# Patient Record
Sex: Female | Born: 1935 | Race: White | Hispanic: No | Marital: Married | Smoking: Never smoker
Health system: Southern US, Community
[De-identification: ages and names within clinical notes are randomized; demographics above are authoritative.]

## PROBLEM LIST (undated history)

## (undated) DIAGNOSIS — E785 Hyperlipidemia, unspecified: Secondary | ICD-10-CM

## (undated) DIAGNOSIS — R296 Repeated falls: Secondary | ICD-10-CM

## (undated) DIAGNOSIS — M1990 Unspecified osteoarthritis, unspecified site: Secondary | ICD-10-CM

## (undated) DIAGNOSIS — K219 Gastro-esophageal reflux disease without esophagitis: Secondary | ICD-10-CM

## (undated) DIAGNOSIS — Z9889 Other specified postprocedural states: Secondary | ICD-10-CM

## (undated) DIAGNOSIS — M810 Age-related osteoporosis without current pathological fracture: Secondary | ICD-10-CM

## (undated) DIAGNOSIS — N390 Urinary tract infection, site not specified: Secondary | ICD-10-CM

## (undated) DIAGNOSIS — R112 Nausea with vomiting, unspecified: Secondary | ICD-10-CM

## (undated) DIAGNOSIS — H269 Unspecified cataract: Secondary | ICD-10-CM

## (undated) DIAGNOSIS — T7840XA Allergy, unspecified, initial encounter: Secondary | ICD-10-CM

## (undated) DIAGNOSIS — I10 Essential (primary) hypertension: Secondary | ICD-10-CM

## (undated) HISTORY — DX: Essential (primary) hypertension: I10

## (undated) HISTORY — DX: Age-related osteoporosis without current pathological fracture: M81.0

## (undated) HISTORY — DX: Hyperlipidemia, unspecified: E78.5

## (undated) HISTORY — DX: Allergy, unspecified, initial encounter: T78.40XA

## (undated) HISTORY — PX: COLONOSCOPY: SHX174

## (undated) HISTORY — DX: Nausea with vomiting, unspecified: R11.2

## (undated) HISTORY — DX: Gastro-esophageal reflux disease without esophagitis: K21.9

## (undated) HISTORY — DX: Repeated falls: R29.6

## (undated) HISTORY — DX: Other specified postprocedural states: Z98.890

## (undated) HISTORY — DX: Unspecified osteoarthritis, unspecified site: M19.90

## (undated) HISTORY — PX: CHOLECYSTECTOMY: SHX55

## (undated) HISTORY — DX: Urinary tract infection, site not specified: N39.0

## (undated) HISTORY — PX: CYST REMOVAL TRUNK: SHX6283

## (undated) HISTORY — DX: Unspecified cataract: H26.9

---

## 1997-05-23 ENCOUNTER — Ambulatory Visit (HOSPITAL_COMMUNITY): Admission: RE | Admit: 1997-05-23 | Discharge: 1997-05-23 | Payer: Self-pay | Admitting: Endocrinology

## 1998-05-30 ENCOUNTER — Encounter: Payer: Self-pay | Admitting: Endocrinology

## 1998-05-30 ENCOUNTER — Ambulatory Visit (HOSPITAL_COMMUNITY): Admission: RE | Admit: 1998-05-30 | Discharge: 1998-05-30 | Payer: Self-pay | Admitting: Endocrinology

## 1998-05-31 ENCOUNTER — Ambulatory Visit (HOSPITAL_COMMUNITY): Admission: RE | Admit: 1998-05-31 | Discharge: 1998-05-31 | Payer: Self-pay | Admitting: Endocrinology

## 1998-07-18 ENCOUNTER — Ambulatory Visit (HOSPITAL_COMMUNITY): Admission: RE | Admit: 1998-07-18 | Discharge: 1998-07-18 | Payer: Self-pay | Admitting: Urology

## 1998-08-02 ENCOUNTER — Other Ambulatory Visit: Admission: RE | Admit: 1998-08-02 | Discharge: 1998-08-02 | Payer: Self-pay | Admitting: Endocrinology

## 1998-08-28 ENCOUNTER — Encounter: Payer: Self-pay | Admitting: Cardiology

## 1998-08-28 ENCOUNTER — Ambulatory Visit (HOSPITAL_COMMUNITY): Admission: RE | Admit: 1998-08-28 | Discharge: 1998-08-28 | Payer: Self-pay | Admitting: Cardiology

## 1999-01-18 ENCOUNTER — Other Ambulatory Visit: Admission: RE | Admit: 1999-01-18 | Discharge: 1999-01-18 | Payer: Self-pay | Admitting: *Deleted

## 1999-01-18 ENCOUNTER — Encounter (INDEPENDENT_AMBULATORY_CARE_PROVIDER_SITE_OTHER): Payer: Self-pay | Admitting: Specialist

## 1999-04-23 ENCOUNTER — Other Ambulatory Visit: Admission: RE | Admit: 1999-04-23 | Discharge: 1999-04-23 | Payer: Self-pay | Admitting: Urology

## 1999-06-05 ENCOUNTER — Encounter: Payer: Self-pay | Admitting: Endocrinology

## 1999-06-05 ENCOUNTER — Ambulatory Visit (HOSPITAL_COMMUNITY): Admission: RE | Admit: 1999-06-05 | Discharge: 1999-06-05 | Payer: Self-pay | Admitting: Endocrinology

## 1999-08-12 ENCOUNTER — Other Ambulatory Visit: Admission: RE | Admit: 1999-08-12 | Discharge: 1999-08-12 | Payer: Self-pay | Admitting: Endocrinology

## 1999-08-26 ENCOUNTER — Encounter: Payer: Self-pay | Admitting: Endocrinology

## 1999-08-26 ENCOUNTER — Ambulatory Visit (HOSPITAL_COMMUNITY): Admission: RE | Admit: 1999-08-26 | Discharge: 1999-08-26 | Payer: Self-pay | Admitting: Endocrinology

## 1999-12-03 ENCOUNTER — Encounter: Payer: Self-pay | Admitting: Endocrinology

## 1999-12-03 ENCOUNTER — Ambulatory Visit (HOSPITAL_COMMUNITY): Admission: RE | Admit: 1999-12-03 | Discharge: 1999-12-03 | Payer: Self-pay | Admitting: Endocrinology

## 1999-12-19 ENCOUNTER — Ambulatory Visit (HOSPITAL_COMMUNITY): Admission: RE | Admit: 1999-12-19 | Discharge: 1999-12-19 | Payer: Self-pay | Admitting: *Deleted

## 1999-12-19 ENCOUNTER — Encounter: Payer: Self-pay | Admitting: *Deleted

## 1999-12-23 ENCOUNTER — Encounter: Payer: Self-pay | Admitting: *Deleted

## 1999-12-23 ENCOUNTER — Ambulatory Visit (HOSPITAL_COMMUNITY): Admission: RE | Admit: 1999-12-23 | Discharge: 1999-12-23 | Payer: Self-pay | Admitting: *Deleted

## 2000-01-28 ENCOUNTER — Encounter: Payer: Self-pay | Admitting: General Surgery

## 2000-01-29 ENCOUNTER — Encounter: Payer: Self-pay | Admitting: General Surgery

## 2000-01-29 ENCOUNTER — Encounter (INDEPENDENT_AMBULATORY_CARE_PROVIDER_SITE_OTHER): Payer: Self-pay | Admitting: *Deleted

## 2000-01-29 ENCOUNTER — Ambulatory Visit (HOSPITAL_COMMUNITY): Admission: RE | Admit: 2000-01-29 | Discharge: 2000-01-30 | Payer: Self-pay | Admitting: General Surgery

## 2000-04-13 ENCOUNTER — Other Ambulatory Visit: Admission: RE | Admit: 2000-04-13 | Discharge: 2000-04-13 | Payer: Self-pay | Admitting: Urology

## 2000-06-11 ENCOUNTER — Encounter: Payer: Self-pay | Admitting: Endocrinology

## 2000-06-11 ENCOUNTER — Ambulatory Visit (HOSPITAL_COMMUNITY): Admission: RE | Admit: 2000-06-11 | Discharge: 2000-06-11 | Payer: Self-pay | Admitting: Endocrinology

## 2001-06-17 ENCOUNTER — Encounter: Payer: Self-pay | Admitting: Obstetrics and Gynecology

## 2001-06-17 ENCOUNTER — Ambulatory Visit (HOSPITAL_COMMUNITY): Admission: RE | Admit: 2001-06-17 | Discharge: 2001-06-17 | Payer: Self-pay | Admitting: Endocrinology

## 2001-09-09 ENCOUNTER — Ambulatory Visit (HOSPITAL_COMMUNITY): Admission: RE | Admit: 2001-09-09 | Discharge: 2001-09-09 | Payer: Self-pay | Admitting: Gastroenterology

## 2001-09-30 ENCOUNTER — Other Ambulatory Visit: Admission: RE | Admit: 2001-09-30 | Discharge: 2001-09-30 | Payer: Self-pay | Admitting: Obstetrics and Gynecology

## 2001-12-15 ENCOUNTER — Encounter: Payer: Self-pay | Admitting: Obstetrics and Gynecology

## 2001-12-15 ENCOUNTER — Encounter: Admission: RE | Admit: 2001-12-15 | Discharge: 2001-12-15 | Payer: Self-pay | Admitting: Obstetrics and Gynecology

## 2002-06-20 ENCOUNTER — Ambulatory Visit (HOSPITAL_COMMUNITY): Admission: RE | Admit: 2002-06-20 | Discharge: 2002-06-20 | Payer: Self-pay | Admitting: Obstetrics and Gynecology

## 2002-06-20 ENCOUNTER — Encounter: Payer: Self-pay | Admitting: Obstetrics and Gynecology

## 2002-10-12 ENCOUNTER — Other Ambulatory Visit: Admission: RE | Admit: 2002-10-12 | Discharge: 2002-10-12 | Payer: Self-pay | Admitting: Obstetrics and Gynecology

## 2003-06-22 ENCOUNTER — Ambulatory Visit (HOSPITAL_COMMUNITY): Admission: RE | Admit: 2003-06-22 | Discharge: 2003-06-22 | Payer: Self-pay | Admitting: Obstetrics and Gynecology

## 2003-10-04 ENCOUNTER — Other Ambulatory Visit: Admission: RE | Admit: 2003-10-04 | Discharge: 2003-10-04 | Payer: Self-pay | Admitting: Obstetrics and Gynecology

## 2004-06-27 ENCOUNTER — Ambulatory Visit (HOSPITAL_COMMUNITY): Admission: RE | Admit: 2004-06-27 | Discharge: 2004-06-27 | Payer: Self-pay | Admitting: Obstetrics and Gynecology

## 2005-08-04 ENCOUNTER — Ambulatory Visit (HOSPITAL_COMMUNITY): Admission: RE | Admit: 2005-08-04 | Discharge: 2005-08-04 | Payer: Self-pay | Admitting: Obstetrics and Gynecology

## 2005-11-12 ENCOUNTER — Other Ambulatory Visit: Admission: RE | Admit: 2005-11-12 | Discharge: 2005-11-12 | Payer: Self-pay | Admitting: Obstetrics and Gynecology

## 2006-08-10 ENCOUNTER — Ambulatory Visit (HOSPITAL_COMMUNITY): Admission: RE | Admit: 2006-08-10 | Discharge: 2006-08-10 | Payer: Self-pay | Admitting: Obstetrics and Gynecology

## 2006-10-22 ENCOUNTER — Encounter: Admission: RE | Admit: 2006-10-22 | Discharge: 2006-10-22 | Payer: Self-pay | Admitting: Endocrinology

## 2006-12-22 ENCOUNTER — Other Ambulatory Visit: Admission: RE | Admit: 2006-12-22 | Discharge: 2006-12-22 | Payer: Self-pay | Admitting: Obstetrics and Gynecology

## 2007-02-08 ENCOUNTER — Encounter: Admission: RE | Admit: 2007-02-08 | Discharge: 2007-02-08 | Payer: Self-pay | Admitting: Obstetrics and Gynecology

## 2007-08-12 ENCOUNTER — Ambulatory Visit (HOSPITAL_COMMUNITY): Admission: RE | Admit: 2007-08-12 | Discharge: 2007-08-12 | Payer: Self-pay | Admitting: Obstetrics and Gynecology

## 2007-08-31 ENCOUNTER — Encounter: Admission: RE | Admit: 2007-08-31 | Discharge: 2007-08-31 | Payer: Self-pay | Admitting: Obstetrics and Gynecology

## 2008-08-14 ENCOUNTER — Encounter: Admission: RE | Admit: 2008-08-14 | Discharge: 2008-08-14 | Payer: Self-pay | Admitting: Obstetrics and Gynecology

## 2008-12-25 ENCOUNTER — Other Ambulatory Visit: Admission: RE | Admit: 2008-12-25 | Discharge: 2008-12-25 | Payer: Self-pay | Admitting: Obstetrics and Gynecology

## 2009-08-21 ENCOUNTER — Encounter: Admission: RE | Admit: 2009-08-21 | Discharge: 2009-08-21 | Payer: Self-pay | Admitting: Endocrinology

## 2010-03-03 HISTORY — PX: AORTA SURGERY: SHX548

## 2010-03-08 ENCOUNTER — Encounter
Admission: RE | Admit: 2010-03-08 | Discharge: 2010-03-08 | Payer: Self-pay | Source: Home / Self Care | Attending: Orthopedic Surgery | Admitting: Orthopedic Surgery

## 2010-03-24 ENCOUNTER — Encounter: Payer: Self-pay | Admitting: Obstetrics and Gynecology

## 2010-04-02 ENCOUNTER — Emergency Department (INDEPENDENT_AMBULATORY_CARE_PROVIDER_SITE_OTHER)
Admission: EM | Admit: 2010-04-02 | Discharge: 2010-04-02 | Payer: No Typology Code available for payment source | Source: Home / Self Care | Admitting: Emergency Medicine

## 2010-04-02 DIAGNOSIS — R51 Headache: Secondary | ICD-10-CM

## 2010-04-02 DIAGNOSIS — M533 Sacrococcygeal disorders, not elsewhere classified: Secondary | ICD-10-CM

## 2010-05-14 ENCOUNTER — Other Ambulatory Visit: Payer: Self-pay | Admitting: Neurosurgery

## 2010-05-14 DIAGNOSIS — I739 Peripheral vascular disease, unspecified: Secondary | ICD-10-CM

## 2010-05-16 ENCOUNTER — Other Ambulatory Visit: Payer: Self-pay | Admitting: Neurosurgery

## 2010-05-16 ENCOUNTER — Ambulatory Visit
Admission: RE | Admit: 2010-05-16 | Discharge: 2010-05-16 | Disposition: A | Discharge status: Home / Self Care | Payer: Medicare Other | Source: Ambulatory Visit | Attending: Neurosurgery | Admitting: Neurosurgery

## 2010-05-16 DIAGNOSIS — I739 Peripheral vascular disease, unspecified: Secondary | ICD-10-CM

## 2010-05-16 MED ORDER — IOHEXOL 350 MG/ML SOLN
100.0000 mL | Freq: Once | INTRAVENOUS | Status: AC | PRN
  Administered 2010-05-16: 100 mL via INTRAVENOUS

## 2010-05-28 ENCOUNTER — Encounter (INDEPENDENT_AMBULATORY_CARE_PROVIDER_SITE_OTHER): Payer: Medicare Other | Admitting: Vascular Surgery

## 2010-05-28 DIAGNOSIS — I70219 Atherosclerosis of native arteries of extremities with intermittent claudication, unspecified extremity: Secondary | ICD-10-CM

## 2010-05-28 NOTE — Consult Note (Signed)
NEW PATIENT CONSULTATION  [name], [name] DOB:  1931-05-20                                       05/28/2010 [id number]#:[id number]  The patient is a pleasant 77 year old female referred by Dr. Wynetta Emery for bilateral lower extremity claudication symptoms and possible aortic stenosis.  Over the last 6-12 months she has been having increasing discomfort in her legs which begins in her hips, extends down into the thighs and eventually the calves.  This starts after walking about 2 blocks and requires her to stop and rest and then the symptoms are relieved.  She has no history of rest pain, nonhealing ulcers, infection or ulceration.  She has had a thorough evaluation by Dr. Wynetta Emery which included a CT angiogram which I have reviewed.  She also had lower extremity arterial Dopplers at [facility] with an exercise test which I have also reviewed.  Her ABIs at rest were 0.79 on the right and 0.86 on the left and these dropped significantly after 5 minutes of exercise and it required 5 minutes for return to baseline.  Her symptoms began at 2 minutes and progressed into her calves by 3-1/2 minutes.  CHRONIC MEDICAL PROBLEMS: 1. Hypertension. 2. Hyperlipidemia. 3. Negative for coronary artery disease, diabetes, COPD or stroke.  SOCIAL HISTORY:  She is married, has 2 children, is retired.  Does not use tobacco or alcohol.  FAMILY HISTORY:  Positive for coronary artery disease in her mother and father.  Negative for diabetes and stroke.  REVIEW OF SYSTEMS:  Positive for leg discomfort but all other systems are negative.  She denies any chest pain, dyspnea on exertion, PND, orthopnea.  She does have occasional blurred vision in both eyes but no specific unilateral visual problems.  All other systems are negative in complete review of systems.  PHYSICAL EXAM:  Vital signs:  Blood pressure 142/68, heart rate is 84, respirations 14.  General:  She is a well-developed,  well-nourished female in no apparent distress, alert and oriented x3.  HEENT:  Exam is normal for age.  EOMs intact.  Lungs:  Clear to auscultation.  No rhonchi or wheezing.  Cardiovascular:  Regular rhythm.  No murmurs. Carotid pulses 3+.  No audible bruits.  Abdomen:  Soft, nontender with no masses.  Musculoskeletal:  Exam reveals no major deformities. Neurological:  Normal.  Skin:  Free of rashes.  Lower extremity:  Exam reveals 2 to 3+ femoral, popliteal and 2+ dorsalis pedis and posterior tibial pulses bilaterally.  I think this patient does have some aortic disease which also may involve the right common iliac artery.  This is probably responsible for her claudication symptoms which are quite classic.  She and her husband feel that this is definitely effecting her lifestyle and she would like this evaluated and treated if possible.  I have scheduled her for an angiogram to be performed by Dr. Myra Gianotti on Tuesday, April 3 with possible PTA and stenting of her aorta and/or iliac arteries if indicated.    Quita Skye Hart [place], M.D. Electronically Signed  JDL/MEDQ  D:  05/28/2010  T:  05/28/2010  Job:  [id number]  cc:   Donalee Citrin, M.D. Brooke Bonito, M.D.

## 2010-06-04 ENCOUNTER — Ambulatory Visit (HOSPITAL_COMMUNITY)
Admission: RE | Admit: 2010-06-04 | Discharge: 2010-06-04 | Disposition: A | Discharge status: Home / Self Care | Payer: Medicare Other | Source: Ambulatory Visit | Attending: Surgery | Admitting: Surgery

## 2010-06-04 DIAGNOSIS — I70219 Atherosclerosis of native arteries of extremities with intermittent claudication, unspecified extremity: Secondary | ICD-10-CM | POA: Insufficient documentation

## 2010-06-04 DIAGNOSIS — I7409 Other arterial embolism and thrombosis of abdominal aorta: Secondary | ICD-10-CM | POA: Insufficient documentation

## 2010-06-04 DIAGNOSIS — Z01810 Encounter for preprocedural cardiovascular examination: Secondary | ICD-10-CM | POA: Insufficient documentation

## 2010-06-04 DIAGNOSIS — I700 Atherosclerosis of aorta: Secondary | ICD-10-CM

## 2010-06-04 LAB — POCT I-STAT, CHEM 8
Calcium, Ion: 1.17 mmol/L (ref 1.12–1.32)
HCT: 43 % (ref 36.0–46.0)
Potassium: 4.2 mEq/L (ref 3.5–5.1)
Sodium: 140 mEq/L (ref 135–145)

## 2010-07-15 NOTE — Op Note (Signed)
[name], [name]              ACCOUNT NO.:  [id number]  MEDICAL RECORD NO.:  [id number]           PATIENT TYPE:  O  LOCATION:  [facility]                         FACILITY:  [facility]  PHYSICIAN:  Juleen China IV, MDDATE OF BIRTH:  Jan 29, 1932  DATE OF PROCEDURE:  06/04/2010 DATE OF DISCHARGE:  06/04/2010                              OPERATIVE REPORT   PREOPERATIVE DIAGNOSES:  Bilateral claudication.  POSTOPERATIVE DIAGNOSIS:  Bilateral claudication.  PROCEDURE PERFORMED: 1. Ultrasound access, right femoral artery. 2. Abdominal aortogram. 3. Stent, abdominal aorta. 4. Stent, right common iliac artery. 5. Closure device.  INDICATIONS:  [name] is a 77 year old female scheduled to undergo back surgery, but has had lifestyle-limiting claudication.  She had a positive exercise test indicating aortoiliac occlusive disease.  She comes in today for arteriogram, possible intervention.  PROCEDURE:  The patient was identified in the holding and taken to room A, placed supine on the table.  The right groin was prepped and draped in usual fashion.  Time-out was called.  Ultrasound was used to evaluate the artery.  It was found to be patent with mild calcification.  It was accessed under ultrasound guidance with an 18-gauge needle.  An 0.035 wire was advanced in the aorta under fluoroscopic visualization.  A 5- French sheath was placed.  An Omni flush catheter was advanced to level of L1.  Abdominal aortogram was obtained in AP and lateral projections.  FINDINGS:  The visualized portions of the suprarenal abdominal aorta showed no significant disease.  There are single renal arteries bilaterally, which are widely patent.  There is a high-grade approximately 85% stenosis within the mid abdominal aorta with a surrounding calcified aorta.  The left common iliac artery is patent. There is mild luminal irregularity at its midportion.  This did not appear to be hemodynamically significant.   The left external iliac artery is widely patent.  The left hypogastric artery is patent.  The right common iliac artery is patent.  There was a high-grade stenosis approximately 70% in its midportion.  The external and internal right iliac arteries are patent.  At this point in time, the decision was made to intervene.  Over a Rosen wire, a 7-French sheath was advanced into the abdominal aorta.  The patient was given systemic heparinization.  I elected to place a 12 x 4 EV3 self-expanding stent across the midaortic lesion.  This was molded to confirmation with a 10-mm balloon taking it to 4 atmospheres.  The waist did resolve.  However, on a followup study, there still appeared to be residual stenosis due to the calcified plaque in this area.  I went back and re-ballooned this area.  This time taking the balloon to 6 atmospheres, which was a nominal pressure on the 10-mm balloon.  I checked the pressure at this point above and below the lesion and despite there being narrowing within the stent, there was no pressure gradient across this area.  At this point, I elected to intervene on the right common iliac lesion.  I placed an Abbott Omni length 9 x 28 balloon expandable stent taking the balloon to nominal pressure.  Completion arteriogram was performed, which showed resolution of stenosis within the right common iliac artery.  At this point, a retrograde shot was performed for closure and found to be adequate.  A ProGlide was successfully deployed.  IMPRESSION: 1. Midaortic stenosis treated with a 12 x 4 EV3 self-expanding stent.     There was residual narrowing.  However, there was no pressure     gradient across the aorta. 2. A high-grade right common iliac stenosis successfully treated using     9 x 28 Omnilink self-expanding stent. 3. Closure device.     Jorge Ny, MD     VWB/MEDQ  D:  06/04/2010  T:  06/05/2010  Job:  [id number]  Electronically Signed by Arelia Longest IV MD on 07/15/2010 10:53:00 PM

## 2010-07-16 ENCOUNTER — Encounter (INDEPENDENT_AMBULATORY_CARE_PROVIDER_SITE_OTHER): Payer: Medicare Other

## 2010-07-16 ENCOUNTER — Ambulatory Visit (INDEPENDENT_AMBULATORY_CARE_PROVIDER_SITE_OTHER): Payer: Medicare Other | Admitting: Vascular Surgery

## 2010-07-16 DIAGNOSIS — Z48812 Encounter for surgical aftercare following surgery on the circulatory system: Secondary | ICD-10-CM

## 2010-07-16 DIAGNOSIS — I70219 Atherosclerosis of native arteries of extremities with intermittent claudication, unspecified extremity: Secondary | ICD-10-CM

## 2010-07-17 NOTE — Assessment & Plan Note (Signed)
OFFICE VISIT  [name], [name] DOB:  April 18, 1931                                       07/16/2010 [id number]#:[id number]  Patient is a 77 year old female patient evaluated by me in March 27 of this year for severe claudication, both lower extremities, and suspicion of aortoiliac occlusive disease.  She underwent aortic stenting and right common iliac stenting by Dr. Durene Cal on April 3.  She had a concentric calcification in her infrarenal aorta which did respond fairly well to angioplasty and stenting.  She has had an excellent hemodynamic result.  Today I ordered lower extremity arterial Dopplers, which I have reviewed and interpreted.  ABIs are now greater than 1 bilaterally, having been in the 0.75 to 0.8 range prior to the procedure.  She has total relief of her symptoms, stating she can ambulate as long as she would like with no pain.  She is being followed by Dr. Wynetta Emery for cervical disk disease.  STABLE CHRONIC MEDICAL PROBLEMS: 1. Hypertension. 2. Hyperlipidemia. 3. Cervical disk disease. 4. Negative for coronary artery disease, diabetes, COPD or stroke.  SOCIAL HISTORY:  She is married and has 2 children.  Is retired.  Does not use tobacco or alcohol.  FAMILY HISTORY:  Positive for coronary artery disease in her mother and father.  Negative for diabetes and stroke.  REVIEW OF SYSTEMS:  Negative for chest pain, dyspnea on exertion, PND, orthopnea.  Has occasional blurred vision.  All other systems are completely negative in review of systems.  PHYSICAL EXAMINATION:  Blood pressure 125/32, heart rate 54, respirations 16.  General:  She is a well-developed and well-nourished female in no apparent distress, alert and oriented x3.  HEENT:  Normal for age.  EOMs intact.  Lungs:  Clear to auscultation.  No rhonchi or wheezing.  Cardiovascular:  Regular rhythm.  No murmurs.  Carotid pulses are 3+.  No audible bruits.  Abdomen:  Soft, nontender  with no masses. She has 3+ femoral, popliteal, dorsalis pedis, and posterior tibial pulses palpable bilaterally.  Musculoskeletal exam is free of major deformities.  Neurologic:  Normal.  Skin is free of rashes.  I think she has had an excellent result.  Will follow her on a regular basis in the vascular lab for patency of her stented vessels.    Quita Skye Hart [place], M.D. Electronically Signed  JDL/MEDQ  D:  07/16/2010  T:  07/17/2010  Job:  [id number]  cc:   Brooke Bonito, M.D. Donalee Citrin, M.D.

## 2010-07-19 NOTE — Op Note (Signed)
[place]. [facility]  Patient:    [name], HELLBERG Visit Number: [id number] MRN: [id number]          Service Type: END Location: ENDO Attending Physician:  Orland Mustard Dictated by:   Llana Aliment. Randa Evens, M.D. Proc. Date: 09/09/01 Admit Date:  09/09/2001 Discharge Date: 09/09/2001   CC:         Lafonda Mosses B. Thomasena Edis, M.D.  Bernadene Person, M.D.   Operative Report  DATE OF BIRTH:  [birth year]  PROCEDURE:  Colonoscopy.  MEDICATIONS:  Fentanyl 100 mcg, Versed 10 mg IV.  INSTRUMENT:  Olympus pediatric colonoscope.  INDICATIONS:  Colon cancer screening.  DESCRIPTION OF PROCEDURE:  The procedure has been explained to the patient and consent obtained. The patient placed in the left lateral decubitus position. The Olympus pediatric scope was inserted and advanced under direct visualization. The prep was excellent. The patient had a very long tortuous colon. Multiple position changes were required and eventually we were able to advance to the cecum with the patient on the right side. The ileocecal valve and appendiceal orifice were seen. The scope was withdrawn. The patient tolerated the procedure well.  ASSESSMENT:  Essentially normal screening colonoscopy.  PLAN:  Will recommend routine follow up with yearly Hemoccults to check stool for blood yearly. Consider another colon screening in 5-10 years. Dictated by:   Llana Aliment. Randa Evens, M.D. Attending Physician:  Orland Mustard DD:  09/09/01 TD:  09/12/01 Job: [id number]

## 2010-07-19 NOTE — Op Note (Signed)
[place]. [facility]  Patient:    [name], SAHM                     MRN: [id number] Proc. Date: 01/29/00 Adm. Date:  [id number] Attending:  Delsa Bern CC:         Sabino Gasser, M.D.  Bernadene Person, M.D.   Operative Report  PREOPERATIVE DIAGNOSIS:  Cholelithiasis.  POSTOPERATIVE DIAGNOSIS:  Cholelithiasis.  SURGICAL PROCEDURE:  Laparoscopic cholecystectomy with intraoperative cholangiogram.  SURGEON:  Sharlet Salina T. Hoxworth, M.D.  ASSISTANTAngelia Mould. Derrell Lolling, M.D.  ANESTHESIA:  General.  BRIEF HISTORY:  This patient is a 77 year old white female who on a recent evaluation for weight loss had a CT scan of the abdomen performed, which revealed not only cholelithiasis, but a questionable lesion in the caudate lobe of the liver adjacent to the gallbladder and slight dilatation of the common bile duct.  Concern was raised about cholangiocarcinoma or a liver lesion.  She subsequently had an MR cholangiogram which did not confirm a liver lesion.  ERCP was attempted, which was unsuccessful.  She has some vague lower abdominal pain.  With this constellation of findings, we elected to proceed with laparoscopic cholecystectomy and intraoperative cholangiogram and evaluation of the liver laparoscopically.  The procedure, its indications, risks of bleeding, infection, bile leak, pelvic injury, and possible need for open procedure were discussed and understood preoperatively.  She is now brought to the operating room for this procedure.  DESCRIPTION OF PROCEDURE:  The patient was brought to the operating room and placed in the supine position on the operating table.  General anesthesia was induced.  The abdomen was sterilely prepped and draped.  She received preoperative antibiotics.  Local anesthesia was used to infiltrate the trocar sites.  A 1 cm incision was made at the umbilicus and dissection was carried down to the midline fascia.   This was sharply incised for 1 cm.  The peritoneum was entered under direct vision.  A Hasson trocar was placed through  mattress sutures of 0 Vicryl and pneumoperitoneum established.  Under direct vision, a 10 mm trocar was placed in the subxiphoid area and two 5 mm trocars along the right subcostal margin.  The gallbladder was visualized and appeared grossly normal, slightly distended.  There were a few adhesions of omentum up to the gallbladder that were taken down with scissors and cautery. The gallbladder was retracted back up over the liver and the infundibulum retracted inferolaterally.  This exposed nicely the caudate lobe and the infundibulum of the gallbladder.  The liver appeared entirely normal with particular attention to the caudate lobe and photographs were taken. Following this, fibrofatty tissue was stripped off the neck of the gallbladder toward the porta hepatis.  The cystic artery was identified and dissected coarsely from the gallbladder wall and was divided between two proximal and one distal clip.  Following this, the cystic duct was dissected free and the cystic duct and gallbladder junction dissected 350 degrees.  When the anatomy was clear, the cystic duct was clipped at the junction of the gallbladder and an operative cholangiogram was obtained through the cystic duct.  There were normal with possibly just some slight dilatation of the common hepatic duct, but there was free flow into the duodenum.  No strictures.  No filling defects.  Following this, the cholangiocath was removed and the cystic duct doubly clipped proximally and divided.  The gallbladder was then dissected from its bed using  hook and spatula cautery and removed intact through the umbilicus.  The operative site was inspected for hemostasis, which was complete.  The trocars were removed under direct vision and all CO2 evacuated from the peritoneal cavity.  The pursestring suture was secured to  the umbilicus.  The skin incisions were closed with interrupted subcuticular 4-0 Monocryl and Dermabond.  The sponge, needle, and instrument counts were correct.  The patient was taken to recovery in good condition, having tolerated the procedure well. DD:  01/29/00 TD:  01/29/00 Job: [id number]

## 2010-07-25 ENCOUNTER — Other Ambulatory Visit (HOSPITAL_COMMUNITY): Payer: Self-pay | Admitting: Endocrinology

## 2010-07-25 ENCOUNTER — Other Ambulatory Visit: Payer: Self-pay | Admitting: Endocrinology

## 2010-07-25 DIAGNOSIS — Z1231 Encounter for screening mammogram for malignant neoplasm of breast: Secondary | ICD-10-CM

## 2010-08-27 ENCOUNTER — Ambulatory Visit
Admission: RE | Admit: 2010-08-27 | Discharge: 2010-08-27 | Disposition: A | Discharge status: Home / Self Care | Payer: Medicare Other | Source: Ambulatory Visit | Attending: Endocrinology | Admitting: Endocrinology

## 2010-08-27 DIAGNOSIS — Z1231 Encounter for screening mammogram for malignant neoplasm of breast: Secondary | ICD-10-CM

## 2010-09-18 ENCOUNTER — Other Ambulatory Visit: Payer: Self-pay | Admitting: Internal Medicine

## 2011-01-22 ENCOUNTER — Other Ambulatory Visit (INDEPENDENT_AMBULATORY_CARE_PROVIDER_SITE_OTHER): Payer: Medicare Other | Admitting: *Deleted

## 2011-01-22 ENCOUNTER — Ambulatory Visit (INDEPENDENT_AMBULATORY_CARE_PROVIDER_SITE_OTHER): Payer: Medicare Other | Admitting: *Deleted

## 2011-01-22 DIAGNOSIS — Z48812 Encounter for surgical aftercare following surgery on the circulatory system: Secondary | ICD-10-CM

## 2011-01-22 DIAGNOSIS — I700 Atherosclerosis of aorta: Secondary | ICD-10-CM

## 2011-01-30 ENCOUNTER — Encounter: Payer: Self-pay | Admitting: Vascular Surgery

## 2011-01-30 NOTE — Procedures (Unsigned)
AORTA-ILIAC DUPLEX EVALUATION  INDICATION:  Aorto-iliac stents.  HISTORY: Diabetes:  No. Cardiac:  No. Hypertension:  Yes. Smoking:  No. Previous Surgery:  Abdominal aortic and left common iliac artery stents on 06/04/2010.              SINGLE LEVEL ARTERIAL EXAM                             RIGHT                  LEFT Brachial: Anterior tibial: Posterior tibial: Peroneal: Ankle/brachial index: Previous ABI/date:  AORTA-ILIAC DUPLEX EXAM Aorta - Proximal     47 cm/s Aorta - Mid          86 cm/s Aorta - Distal       160 cm/s  RIGHT                                   LEFT 85 cm/s           CIA-PROXIMAL          135 cm/s 112 cm/s          CIA-DISTAL            113 cm/s                   HYPOGASTRIC                   EIA-PROXIMAL                   EIA-MID                   EIA-DISTAL  IMPRESSION:  Patent abdominal aortic and left common iliac artery stents with calcific shadowing of the distal abdominal aorta noted.  Velocity of 160 cm/s noted in the distal abdominal aorta, baed on limited visualization.  ___________________________________________ Quita Skye Hart [place], M.D.  CH/MEDQ  D:  01/22/2011  T:  01/22/2011  Job:  [id number]

## 2011-02-03 ENCOUNTER — Other Ambulatory Visit: Payer: Self-pay

## 2011-02-03 DIAGNOSIS — Z48812 Encounter for surgical aftercare following surgery on the circulatory system: Secondary | ICD-10-CM

## 2011-02-03 DIAGNOSIS — I700 Atherosclerosis of aorta: Secondary | ICD-10-CM

## 2011-07-09 ENCOUNTER — Encounter (INDEPENDENT_AMBULATORY_CARE_PROVIDER_SITE_OTHER): Payer: Medicare Other | Admitting: *Deleted

## 2011-07-09 ENCOUNTER — Other Ambulatory Visit (INDEPENDENT_AMBULATORY_CARE_PROVIDER_SITE_OTHER): Payer: Medicare Other | Admitting: *Deleted

## 2011-07-09 DIAGNOSIS — I700 Atherosclerosis of aorta: Secondary | ICD-10-CM

## 2011-07-09 DIAGNOSIS — Z48812 Encounter for surgical aftercare following surgery on the circulatory system: Secondary | ICD-10-CM

## 2011-07-14 NOTE — Procedures (Unsigned)
AORTA-ILIAC DUPLEX EVALUATION  INDICATION:  Follow up aorto-iliac stents.  HISTORY: Diabetes:  No. Cardiac:  No. Hypertension:  Yes. Smoking:  No. Previous Surgery:  Abdominal aortic and left common iliac artery stents on 06/04/2010.              SINGLE LEVEL ARTERIAL EXAM                             RIGHT                  LEFT Brachial:                  170                    169 Anterior tibial:           172                    180 Posterior tibial:          197                    193 Peroneal: Ankle/brachial index:      1.16                   1.14 Previous ABI/date:         01/22/2011, 1.12       01/22/2011, 1.15  AORTA-ILIAC DUPLEX EXAM Aorta - Proximal     114 cm/s Aorta - Mid          104 cm/s Aorta - Distal       139 cm/s  RIGHT                                   LEFT 165 cm/s          CIA-PROXIMAL          156 cm/s 193 cm/s          CIA-DISTAL            169 cm/s                   HYPOGASTRIC                   EIA-PROXIMAL                   EIA-MID                   EIA-DISTAL  IMPRESSION: 1. Patent abdominal aortic and left common iliac artery stents with     calcific shadowing of the distal abdominal aorta noted.  There is a     velocity of 139 cm/s noted in the distal abdominal aorta. 2. Bilateral ankle brachial indices are within normal limits.  ___________________________________________ [name], M.D.  EM/MEDQ  D:  07/09/2011  T:  07/09/2011  Job:  [id number]

## 2011-07-28 ENCOUNTER — Other Ambulatory Visit (HOSPITAL_BASED_OUTPATIENT_CLINIC_OR_DEPARTMENT_OTHER): Payer: Self-pay | Admitting: Endocrinology

## 2011-07-30 ENCOUNTER — Other Ambulatory Visit: Payer: Self-pay | Admitting: Endocrinology

## 2011-07-30 DIAGNOSIS — Z1231 Encounter for screening mammogram for malignant neoplasm of breast: Secondary | ICD-10-CM

## 2011-09-02 ENCOUNTER — Ambulatory Visit: Payer: Medicare Other

## 2011-09-02 ENCOUNTER — Ambulatory Visit (INDEPENDENT_AMBULATORY_CARE_PROVIDER_SITE_OTHER): Payer: Medicare Other

## 2011-09-02 DIAGNOSIS — Z1231 Encounter for screening mammogram for malignant neoplasm of breast: Secondary | ICD-10-CM

## 2012-05-04 ENCOUNTER — Other Ambulatory Visit: Payer: Self-pay | Admitting: Endocrinology

## 2012-07-20 ENCOUNTER — Encounter: Payer: Self-pay | Admitting: Obstetrics & Gynecology

## 2012-07-20 ENCOUNTER — Ambulatory Visit (INDEPENDENT_AMBULATORY_CARE_PROVIDER_SITE_OTHER): Payer: Medicare Other | Admitting: Obstetrics & Gynecology

## 2012-07-20 VITALS — BP 132/66 | HR 80 | Resp 14 | Ht 64.0 in | Wt 118.0 lb

## 2012-07-20 DIAGNOSIS — Z01419 Encounter for gynecological examination (general) (routine) without abnormal findings: Secondary | ICD-10-CM

## 2012-07-20 DIAGNOSIS — Z0000 Encounter for general adult medical examination without abnormal findings: Secondary | ICD-10-CM

## 2012-07-20 NOTE — Progress Notes (Signed)
Subjective:    [name] is a 79 y.o. female who presents for an annual exam. The patient has no complaints today. The patient is not currently sexually active. GYN screening history: last pap: was normal. The patient wears seatbelts: yes. The patient participates in regular exercise: yes. Has the patient ever been transfused or tattooed?: no. The patient reports that there is not domestic violence in her life.   Menstrual History: OB History   Grav Para Term Preterm Abortions TAB SAB Ect Mult Living                  Menarche age: 51  No LMP recorded. Patient is postmenopausal.    The following portions of the patient's history were reviewed and updated as appropriate: allergies, current medications, past family history, past medical history, past social history, past surgical history and problem list.  Review of Systems A comprehensive review of systems was negative. She has been married for 57 years and lives with her husband in a retirement community. She is retired from the [employer]. Her mammogram, DEXA, and colonoscopy are utd.   Objective:    BP 132/66  Pulse 80  Resp 14  Ht 5\' 4"  (1.626 m)  Wt 118 lb (53.524 kg)  BMI 20.24 kg/m2  General Appearance:    Alert, cooperative, no distress, appears stated age  Head:    Normocephalic, without obvious abnormality, atraumatic  Eyes:    PERRL, conjunctiva/corneas clear, EOM's intact, fundi    benign, both eyes  Ears:    Normal TM's and external ear canals, both ears  Nose:   Nares normal, septum midline, mucosa normal, no drainage    or sinus tenderness  Throat:   Lips, mucosa, and tongue normal; teeth and gums normal  Neck:   Supple, symmetrical, trachea midline, no adenopathy;    thyroid:  no enlargement/tenderness/nodules; no carotid   bruit or JVD  Back:     Symmetric, no curvature, ROM normal, no CVA tenderness  Lungs:     Clear to auscultation bilaterally, respirations unlabored  Chest Wall:    No  tenderness or deformity   Heart:    Regular rate and rhythm, S1 and S2 normal, no murmur, rub   or gallop  Breast Exam:    No tenderness, masses, or nipple abnormality  Abdomen:     Soft, non-tender, bowel sounds active all four quadrants,    no masses, no organomegaly  Genitalia:    Normal female without lesion, discharge or tenderness, severe vulvovaginal atrophy, no vaginal lesions, NSSR, NT, no adnexal masses     Extremities:   Extremities normal, atraumatic, no cyanosis or edema  Pulses:   2+ and symmetric all extremities  Skin:   Skin color, texture, turgor normal, no rashes or lesions  Lymph nodes:   Cervical, supraclavicular, and axillary nodes normal  Neurologic:   CNII-XII intact, normal strength, sensation and reflexes    throughout  .    Assessment:    Healthy female exam.    Plan:     Breast self exam technique reviewed and patient encouraged to perform self-exam monthly.

## 2012-09-07 ENCOUNTER — Ambulatory Visit (INDEPENDENT_AMBULATORY_CARE_PROVIDER_SITE_OTHER): Payer: Medicare Other

## 2012-09-07 DIAGNOSIS — Z1231 Encounter for screening mammogram for malignant neoplasm of breast: Secondary | ICD-10-CM

## 2012-10-19 ENCOUNTER — Other Ambulatory Visit: Payer: Self-pay

## 2012-10-19 ENCOUNTER — Emergency Department (HOSPITAL_BASED_OUTPATIENT_CLINIC_OR_DEPARTMENT_OTHER)
Admission: EM | Admit: 2012-10-19 | Discharge: 2012-10-19 | Disposition: A | Discharge status: Home / Self Care | Payer: Medicare Other | Attending: Emergency Medicine | Admitting: Emergency Medicine

## 2012-10-19 ENCOUNTER — Encounter (HOSPITAL_BASED_OUTPATIENT_CLINIC_OR_DEPARTMENT_OTHER): Payer: Self-pay | Admitting: *Deleted

## 2012-10-19 DIAGNOSIS — M129 Arthropathy, unspecified: Secondary | ICD-10-CM | POA: Insufficient documentation

## 2012-10-19 DIAGNOSIS — R079 Chest pain, unspecified: Secondary | ICD-10-CM

## 2012-10-19 DIAGNOSIS — Z79899 Other long term (current) drug therapy: Secondary | ICD-10-CM | POA: Insufficient documentation

## 2012-10-19 DIAGNOSIS — K219 Gastro-esophageal reflux disease without esophagitis: Secondary | ICD-10-CM | POA: Insufficient documentation

## 2012-10-19 DIAGNOSIS — M542 Cervicalgia: Secondary | ICD-10-CM | POA: Insufficient documentation

## 2012-10-19 DIAGNOSIS — E785 Hyperlipidemia, unspecified: Secondary | ICD-10-CM | POA: Insufficient documentation

## 2012-10-19 DIAGNOSIS — G478 Other sleep disorders: Secondary | ICD-10-CM | POA: Insufficient documentation

## 2012-10-19 DIAGNOSIS — I10 Essential (primary) hypertension: Secondary | ICD-10-CM | POA: Insufficient documentation

## 2012-10-19 DIAGNOSIS — Z7982 Long term (current) use of aspirin: Secondary | ICD-10-CM | POA: Insufficient documentation

## 2012-10-19 DIAGNOSIS — Z9889 Other specified postprocedural states: Secondary | ICD-10-CM | POA: Insufficient documentation

## 2012-10-19 DIAGNOSIS — R12 Heartburn: Secondary | ICD-10-CM | POA: Insufficient documentation

## 2012-10-19 NOTE — ED Provider Notes (Signed)
CSN: [id number]     Arrival date & time 10/19/12  1356 History     First MD Initiated Contact with Patient 10/19/12 1406     Chief Complaint  Patient presents with  . Facial Pain    HPI   Patient presents with an episode of a strange feeling in her mouth and jaw and throat. They just returned from lunch. States she sat on the couch and the feeling began. It lasted 5 or 10 minutes. It was not tightness, not in pain, or pressure. No associated nausea. No shortness of breath. No diaphoresis. She states she has occasional heartburn she states she thought this was what it was. The feeling in her back of her throat was a little bit different for her. After about 10 minutes her husband got her some aspirin. She saw this was a drink of water and she states the pain went instantly away. She is very active. She exercises every day. [facility] she does water aerobics or power walking for 45 minutes to 60 minutes every day she did yesterday. She did today. He is on no recent exertional symptoms that limit her in any way. She has no history of cardiac disease. Past Medical History  Diagnosis Date  . Hyperlipidemia   . Hypertension   . Allergy   . Arthritis    Past Surgical History  Procedure Laterality Date  . Aorta surgery  03/03/2010   Family History  Problem Relation Age of Onset  . Heart disease Mother   . Heart disease Father    History  Substance Use Topics  . Smoking status: Never Smoker   . Smokeless tobacco: Never Used  . Alcohol Use: Yes     Comment: socially   OB History   Grav Para Term Preterm Abortions TAB SAB Ect Mult Living                 Review of Systems  Constitutional: Negative for fever, chills, diaphoresis, appetite change and fatigue.  HENT: Positive for neck pain. Negative for sore throat, mouth sores and trouble swallowing.   Eyes: Negative for visual disturbance.  Respiratory: Negative for cough, chest tightness, shortness of breath and wheezing.    Cardiovascular: Negative for chest pain.  Gastrointestinal: Negative for nausea, vomiting, abdominal pain, diarrhea and abdominal distention.  Endocrine: Negative for polydipsia, polyphagia and polyuria.  Genitourinary: Negative for dysuria, frequency and hematuria.  Musculoskeletal: Negative for gait problem.  Skin: Negative for color change, pallor and rash.  Neurological: Negative for dizziness, syncope, light-headedness and headaches.  Hematological: Does not bruise/bleed easily.  Psychiatric/Behavioral: Negative for behavioral problems and confusion.    Allergies  Review of patient's allergies indicates no known allergies.  Home Medications   Current Outpatient Rx  Name  Route  Sig  Dispense  Refill  . aspirin 81 MG tablet   Oral   Take 81 mg by mouth daily.         . calcium acetate (PHOSLO) 667 MG capsule   Oral   Take 667 mg by mouth 3 (three) times daily with meals.         . cetirizine (ZYRTEC) 10 MG tablet   Oral   Take 10 mg by mouth daily.         . CRESTOR 20 MG tablet   Oral   Take 20 mg by mouth daily.         . fish oil-omega-3 fatty acids 1000 MG capsule   Oral  Take 2 g by mouth daily.         Marland Kitchen gabapentin (NEURONTIN) 300 MG capsule   Oral   Take 300 mg by mouth daily.         Marland Kitchen lisinopril-hydrochlorothiazide (PRINZIDE,ZESTORETIC) 10-12.5 MG per tablet   Oral   Take 10-12.5 tablets by mouth daily.         . Multiple Vitamin (MULTIVITAMIN) capsule   Oral   Take 1 capsule by mouth daily.          BP 142/57  Pulse 93  Temp(Src) 98.1 F (36.7 C) (Oral)  Resp 18  Ht 5\' 4"  (1.626 m)  Wt 119 lb (53.978 kg)  BMI 20.42 kg/m2  SpO2 95% Physical Exam  Constitutional: She is oriented to person, place, and time. She appears well-developed and well-nourished. No distress.  HENT:  Head: Normocephalic.  Eyes: Conjunctivae are normal. Pupils are equal, round, and reactive to light. No scleral icterus.  Neck: Normal range of  motion. Neck supple. No thyromegaly present.  Cardiovascular: Normal rate and regular rhythm.  Exam reveals no gallop and no friction rub.   No murmur heard. Pulmonary/Chest: Effort normal and breath sounds normal. No respiratory distress. She has no wheezes. She has no rales.  Abdominal: Soft. Bowel sounds are normal. She exhibits no distension. There is no tenderness. There is no rebound.  Musculoskeletal: Normal range of motion.  Neurological: She is alert and oriented to person, place, and time.  Skin: Skin is warm and dry. No rash noted.  Psychiatric: She has a normal mood and affect. Her behavior is normal.    ED Course    EKG: Indication chest pain. Patient not having pain at the time of the EKG shows normal sinus rhythm no acute ischemic changes no injury or ectopy.  Procedures (including critical care time)  Labs Reviewed - No data to display No results found. 1. Neck pain   2. GERD (gastroesophageal reflux disease)   3. Chest pain     MDM  Chest normal exam. The symptoms of transient, and on for less than 10 minutes. It resolved with water. With any chest, neck and Jaw pain I could consider angina as a source. Her symptoms were resolved prior to arrival and stayed that way. She exercises frequently without exertional symptoms. I think this is unlikely this is cardiac event. I do not think doing serial enzymes would be beneficial as her symptoms are present less than 10 minutes. Her EKG is normal I think outpatient followup with her primary care physician he also would be indicated. Given her careful precautions that if her symptoms recur she should be immediately evaluated.    Claudean Kinds, MD 10/20/12 [phone]

## 2012-10-19 NOTE — ED Notes (Signed)
Patient states that she was having pain on both sides of her face that radiated down to her midsternal area. She states that the pain has resolved at this time and she no longer has symptoms.

## 2012-11-11 ENCOUNTER — Other Ambulatory Visit: Payer: Self-pay | Admitting: Endocrinology

## 2012-11-11 DIAGNOSIS — M5431 Sciatica, right side: Secondary | ICD-10-CM

## 2012-11-17 ENCOUNTER — Ambulatory Visit
Admission: RE | Admit: 2012-11-17 | Discharge: 2012-11-17 | Disposition: A | Discharge status: Home / Self Care | Payer: Medicare Other | Source: Ambulatory Visit | Attending: Endocrinology | Admitting: Endocrinology

## 2012-11-17 DIAGNOSIS — M5431 Sciatica, right side: Secondary | ICD-10-CM

## 2012-11-19 ENCOUNTER — Other Ambulatory Visit: Payer: Medicare Other

## 2012-12-28 ENCOUNTER — Other Ambulatory Visit: Payer: Self-pay | Admitting: Dermatology

## 2013-03-18 ENCOUNTER — Other Ambulatory Visit: Payer: Self-pay | Admitting: Endocrinology

## 2013-03-18 DIAGNOSIS — Z1231 Encounter for screening mammogram for malignant neoplasm of breast: Secondary | ICD-10-CM

## 2013-05-18 ENCOUNTER — Encounter: Payer: Self-pay | Admitting: Family Medicine

## 2013-05-18 ENCOUNTER — Ambulatory Visit (INDEPENDENT_AMBULATORY_CARE_PROVIDER_SITE_OTHER): Payer: Medicare Other | Admitting: Family Medicine

## 2013-05-18 VITALS — BP 144/86 | HR 88

## 2013-05-18 DIAGNOSIS — M999 Biomechanical lesion, unspecified: Secondary | ICD-10-CM | POA: Insufficient documentation

## 2013-05-18 DIAGNOSIS — M48062 Spinal stenosis, lumbar region with neurogenic claudication: Secondary | ICD-10-CM

## 2013-05-18 DIAGNOSIS — M533 Sacrococcygeal disorders, not elsewhere classified: Secondary | ICD-10-CM

## 2013-05-18 MED ORDER — GABAPENTIN 100 MG PO CAPS: 100.0000 mg | ORAL_CAPSULE | Freq: Every day | ORAL | Status: DC

## 2013-05-18 NOTE — Progress Notes (Signed)
  Tawana ScaleZach Perry Molla D.O. [facility] [address] [place], [place] [address] Phone: [phone] Subjective:       CC: back pain  VQQ:VZDGLOVFIEHPI:Subjective [name] is a 80 y.o. female coming in with complaint of back pain. Patient has had back pain for greater than 6 months. Patient has even had an MRI of his back. I did review his MRI. Patient's MRI did show that she had multilevel degenerative disc disease with moderate spinal stenosis and significant foraminal narrowing of the lower lumbar region.  Patient states her pain seems to be now a chronic problem. Patient walks and after approximately 100-200 yards she can feel that she is having some weakness in her legs. Patient has been on gabapentin which unfortunately causes her to fall from time to time. Patient is just taking this one time during the day. Patient does do exercises most days a week at her assisted living facility. Patient is still very active and does a lot of gardening but states that she does that she paced the price later on. Patient denies ever waking up as any numbness in the legs but states her feet can go numb if she stands for too long. Patient rates the severity of pain at 6/10 but still able to do activities of daily living. Patient denies any bowel or bladder changes out of the ordinary or any fevers or chills    Past medical history, social, surgical and family history all reviewed in electronic medical record.   Review of Systems: No headache, visual changes, nausea, vomiting, diarrhea, constipation, dizziness, abdominal pain, skin rash, fevers, chills, night sweats, weight loss, swollen lymph nodes, body aches, joint swelling, muscle aches, chest pain, shortness of breath, mood changes.   Objective Blood pressure 144/86, pulse 88, SpO2 98.00%.  General: No apparent distress alert and oriented x3 mood and affect normal, dressed appropriately. Somewhat frail HEENT: Pupils equal, extraocular movements intact    Respiratory: Patient's speak in full sentences and does not appear short of breath  Cardiovascular: No lower extremity edema, non tender, no erythema  Skin: Warm dry intact with no signs of infection or rash on extremities or on axial skeleton.  Abdomen: Soft nontender  Neuro: Cranial nerves II through XII are intact, neurovascularly intact in all extremities with 2+ DTRs and 2+ pulses.  Lymph: No lymphadenopathy of posterior or anterior cervical chain or axillae bilaterally.  Gait normal with good balance and coordination.  MSK:  Non tender with full range of motion and good stability and symmetric strength and tone of shoulders, elbows, wrist, hip, knee and ankles bilaterally.  Back Exam:  Inspection: Mild increase in kyphosis Motion: Flexion 35 deg, Extension 25 deg, Side Bending to 35 deg bilaterally,  Rotation to 25 deg bilaterally  SLR laying: Negative  XSLR laying: Negative  Palpable tenderness: Tender to palpation in the bilateral lumbar paraspinal musculature. FABER: negative. Sensory change: Gross sensation intact to all lumbar and sacral dermatomes.  Reflexes: 2+ at both patellar tendons, 2+ at achilles tendons, Babinski's downgoing.  Strength at foot  Plantar-flexion: 5/5 Dorsi-flexion: 5/5 Eversion: 5/5 Inversion: 5/5  Leg strength  Quad: 5/5 Hamstring: 4/5 Hip flexor: 4/5 Hip abductors: 3/5  Gait unremarkable. Osteopathic findings of the sacrum rotated left on left   Impression and Recommendations:     This case required medical decision making of moderate complexity.

## 2013-05-18 NOTE — Assessment & Plan Note (Signed)
Does have spinal stenosis at multiple levels.  Discussed neurontin causing some balance problem and will decrease amount.  Will see how patient does and discussed changing medication to nighttime dosing.  Discussed other medication over the counter.  Stretches that can help and will see if patient responds to OMT>

## 2013-05-18 NOTE — Assessment & Plan Note (Signed)
Decision today to treat with OMT was based on Physical Exam  After verbal consent patient was treated with ME, indirect techniques in sacral areas  Patient tolerated the procedure well with improvement in symptoms  Patient given exercises, stretches and lifestyle modifications  See medications in patient instructions if given  Patient will follow up in 4 weeks

## 2013-05-18 NOTE — Patient Instructions (Addendum)
Very nice to meet you Try theses exercises at night Sacroiliac Joint Mobilization and Rehab 1. Work on pretzel stretching, shoulder back and leg draped in front. 3-5 sets, 30 sec.. 2. hip abductor rotations. standing, hip flexion and rotation outward then inward. 3 sets, 15 reps. when can do comfortably, add ankle weights starting at 2 pounds.  3. cross over stretching - shoulder back to ground, same side leg crossover. 3-5 sets for 30 min..  4. rolling up and back knees to chest and rocking. 5. sacral tilt - 5 sets, hold for 5-10 seconds  Start new gabapentin 100mg  at night. Stop the 300mg .  Take tylenol 650 mg three times a day is the best evidence based medicine we have for arthritis.  Glucosamine sulfate 750mg  twice a day is a supplement that has been shown to help moderate to severe arthritis. Vitamin D 2000 IU daily Fish oil 2 grams daily.  Tumeric 500mg  twice daily.  Capsaicin topically up to four times a day may also help with pain. It's important that you continue to stay active. Consider physical therapy to strengthen muscles around the joint that hurts to take pressure off of the joint itself. Shoe inserts with good arch support may be helpful.  [employer] [facility] [employer] could help.  Water aerobics and cycling with low resistance are the best two types of exercise for arthritis. Come back and see me in 3-4 weeks.

## 2013-05-18 NOTE — Assessment & Plan Note (Signed)
Pain today localized to sacral area, attempted OMT with moderate success, discussed HEP and showed patient proper techniqe.  Discussed icing protocol. Discussed self manipulating technique safe for patient.  RTC in 4 weeks if still in pain can consider injection or more OMT.

## 2013-06-02 ENCOUNTER — Ambulatory Visit (INDEPENDENT_AMBULATORY_CARE_PROVIDER_SITE_OTHER): Payer: Medicare Other | Admitting: Family Medicine

## 2013-06-02 ENCOUNTER — Encounter: Payer: Self-pay | Admitting: Family Medicine

## 2013-06-02 VITALS — BP 124/78 | HR 78 | Wt 115.0 lb

## 2013-06-02 DIAGNOSIS — M533 Sacrococcygeal disorders, not elsewhere classified: Secondary | ICD-10-CM

## 2013-06-02 DIAGNOSIS — M999 Biomechanical lesion, unspecified: Secondary | ICD-10-CM

## 2013-06-02 DIAGNOSIS — M48062 Spinal stenosis, lumbar region with neurogenic claudication: Secondary | ICD-10-CM

## 2013-06-02 NOTE — Assessment & Plan Note (Signed)
Patient's pain is multifactorial. Patient does have spinal stenosis as well as sacroiliac dysfunction that is contributing. Patient did respond once again to osteopathic manipulation a day which would be helpful. Encourage her to actually start taking gabapentin which was written for previously the patient is continued on her own. Patient will try 100 mg at night and see if she can tolerate. I would like to avoid titrating secondary to patient's age and size. Discussed with patient I would have her see me again in 3-6 weeks for further evaluation.

## 2013-06-02 NOTE — Assessment & Plan Note (Signed)
Starting in her gabapentin again.

## 2013-06-02 NOTE — Assessment & Plan Note (Signed)
Decision today to treat with OMT was based on Physical Exam  After verbal consent patient was treated with ME, indirect techniques in sacral areas  Patient tolerated the procedure well with improvement in symptoms  Patient given exercises, stretches and lifestyle modifications  See medications in patient instructions if given  Patient will follow up in 3-4 weeks      

## 2013-06-02 NOTE — Progress Notes (Signed)
  Tawana ScaleZach Smith D.O. [facility] [address] [place], [place] [address] Phone: [phone] Subjective:       CC: back pain si joint dysfunction.   GEX:[id number]:Subjective Donna B Etheleen MayhewFuquay is a 80 y.o. female coming in with complaint of back pain. Patient has known history of degenerative joint disease and degenerative disc disease of the lumbar spine. Patient was seen previously and responded extremely well to osteopathic manipulation. Patient states that she had 2 weeks of being nearly pain free but unfortunately last week she started having worsening worsening pain. Patient states now it is as bad as it was previously. Still able to function and do daily activities but unfortunately pain is 6/10. Not responding over-the-counter medications. Denies any significant radiation down the leg but some of it is going on the anterior lateral aspect of her leg stopping her knee she states.     Past medical history, social, surgical and family history all reviewed in electronic medical record.   Review of Systems: No headache, visual changes, nausea, vomiting, diarrhea, constipation, dizziness, abdominal pain, skin rash, fevers, chills, night sweats, weight loss, swollen lymph nodes, body aches, joint swelling, muscle aches, chest pain, shortness of breath, mood changes.   Objective Blood pressure 124/78, pulse 78, weight 115 lb (52.164 kg), SpO2 99.00%.  General: No apparent distress alert and oriented x3 mood and affect normal, dressed appropriately. Somewhat frail HEENT: Pupils equal, extraocular movements intact  Respiratory: Patient's speak in full sentences and does not appear short of breath  Cardiovascular: No lower extremity edema, non tender, no erythema  Skin: Warm dry intact with no signs of infection or rash on extremities or on axial skeleton.  Abdomen: Soft nontender  Neuro: Cranial nerves II through XII are intact, neurovascularly intact in all extremities with 2+ DTRs and 2+  pulses.  Lymph: No lymphadenopathy of posterior or anterior cervical chain or axillae bilaterally.  Gait normal with good balance and coordination.  MSK:  Non tender with full range of motion and good stability and symmetric strength and tone of shoulders, elbows, wrist, hip, knee and ankles bilaterally.  Back Exam:  Inspection: Mild increase in kyphosis Motion: Flexion 35 deg, Extension 35 deg, Side Bending to 35 deg bilaterally,  Rotation to 25 deg bilaterally  SLR laying: Negative  XSLR laying: Negative  Palpable tenderness: Tender to palpation in the bilateral lumbar paraspinal musculature and significant over the left SI joint FABER: negative. Sensory change: Gross sensation intact to all lumbar and sacral dermatomes.  Reflexes: 2+ at both patellar tendons, 2+ at achilles tendons, Babinski's downgoing.  Strength at foot  Plantar-flexion: 5/5 Dorsi-flexion: 5/5 Eversion: 5/5 Inversion: 5/5  Leg strength  Quad: 5/5 Hamstring: 4/5 Hip flexor: 4/5 Hip abductors: 3/5  Gait unremarkable. Osteopathic findings of the sacrum rotated left on left   Impression and Recommendations:     This case required medical decision making of moderate complexity.

## 2013-06-13 ENCOUNTER — Ambulatory Visit: Payer: Medicare Other | Admitting: Family Medicine

## 2013-08-03 ENCOUNTER — Ambulatory Visit (INDEPENDENT_AMBULATORY_CARE_PROVIDER_SITE_OTHER): Payer: Medicare Other | Admitting: Family Medicine

## 2013-08-03 ENCOUNTER — Encounter: Payer: Self-pay | Admitting: Family Medicine

## 2013-08-03 VITALS — BP 134/80 | HR 77 | Ht 64.0 in

## 2013-08-03 DIAGNOSIS — M533 Sacrococcygeal disorders, not elsewhere classified: Secondary | ICD-10-CM

## 2013-08-03 DIAGNOSIS — M999 Biomechanical lesion, unspecified: Secondary | ICD-10-CM

## 2013-08-03 DIAGNOSIS — M48062 Spinal stenosis, lumbar region with neurogenic claudication: Secondary | ICD-10-CM

## 2013-08-03 MED ORDER — GABAPENTIN 100 MG PO CAPS: ORAL_CAPSULE | ORAL | Status: DC

## 2013-08-03 NOTE — Progress Notes (Signed)
  [facility] [address] [place], [place] [address] Phone: [phone] Subjective:       CC: back pain si joint dysfunction.   XHB:[id number] [name] is a 80 y.o. female coming in with complaint of back pain. Patient has known history of degenerative joint disease and degenerative disc disease of the lumbar spine. Patient does have known spinal stenosis as well. Patient states that her low back is now radiating to both sides. Patient is to have a localized on the left side. Patient states that from time to time she did radiation down the back of her legs as well. Patient denies any significant weakness but has fallen a couple times. Patient lives or so on the pain medications she was taking because of her back. Patient denies any bowel or bladder incontinence. Patient is of the pain seems to be worse. Patient had respond to osteopathic manipulation previously and states that she did get some relief for approximately 2 weeks before the pain started to come back. Patient states it seems to be worse now than it has in multiple weeks.    Past medical history, social, surgical and family history all reviewed in electronic medical record.   Review of Systems: No headache, visual changes, nausea, vomiting, diarrhea, constipation, dizziness, abdominal pain, skin rash, fevers, chills, night sweats, weight loss, swollen lymph nodes, body aches, joint swelling, muscle aches, chest pain, shortness of breath, mood changes.   Objective Blood pressure 134/80, pulse 77, height 5\' 4"  (1.626 m), SpO2 94.00%.  General: No apparent distress alert and oriented x3 mood and affect normal, dressed appropriately. Somewhat frail HEENT: Pupils equal, extraocular movements intact  Respiratory: Patient's speak in full sentences and does not appear short of breath  Cardiovascular: No lower extremity edema, non tender, no erythema  Skin: Warm dry intact with no signs of infection or  rash on extremities or on axial skeleton.  Abdomen: Soft nontender  Neuro: Cranial nerves II through XII are intact, neurovascularly intact in all extremities with 2+ DTRs and 2+ pulses.  Lymph: No lymphadenopathy of posterior or anterior cervical chain or axillae bilaterally.  Gait normal with good balance and coordination.  MSK:  Non tender with full range of motion and good stability and symmetric strength and tone of shoulders, elbows, wrist, hip, knee and ankles bilaterally.  Back Exam:  Inspection: Mild increase in kyphosis Motion: Flexion 30 deg, Extension 25 deg, Side Bending to 35 deg bilaterally,  Rotation to 25 deg bilaterally  SLR laying: Negative  XSLR laying: Negative  Palpable tenderness: Tender to palpation in the bilateral lumbar paraspinal musculature and significant over the left SI joint FABER: Positive left. Sensory change: Gross sensation intact to all lumbar and sacral dermatomes.  Reflexes: 2+ at both patellar tendons, 2+ at achilles tendons, Babinski's downgoing.  Strength at foot  Plantar-flexion: 5/5 Dorsi-flexion: 5/5 Eversion: 5/5 Inversion: 5/5  Leg strength  Quad: 5/5 Hamstring: 4/5 Hip flexor: 4/5 Hip abductors: 3/5  Gait shows mild broad-based gait Osteopathic findings of the sacrum rotated left on left    Impression and Recommendations:     This case required medical decision making of moderate complexity.

## 2013-08-03 NOTE — Assessment & Plan Note (Signed)
Decision today to treat with OMT was based on Physical Exam  After verbal consent patient was treated with ME, indirect techniques in sacral areas  Patient tolerated the procedure well with improvement in symptoms  Patient given exercises, stretches and lifestyle modifications  See medications in patient instructions if given  Patient will follow up in 3-4 weeks

## 2013-08-03 NOTE — Patient Instructions (Addendum)
Good to see you Lets go up on the gabapentin to 100mg  in AM and PM then 300mg  at night.  Continue the medications.  New exercises try to do 3 times a week.  Come back in 3 weeks.

## 2013-08-03 NOTE — Assessment & Plan Note (Signed)
Patient was given new exercises and see if patient can do some self manipulation at home. We discussed the topical medications are to be beneficial as well. Patient now underwent a prescription to. Patient will try the interventions as well as icing and come back again in 3 weeks for further evaluation

## 2013-08-03 NOTE — Assessment & Plan Note (Signed)
Patient's underlying problem is likely more of the spinal stenosis in the numbness or weakness patient feels her leg is likely secondary to this as well and the neurogenic claudication. We discussed about different treatment options for this. Patient would like to continue with medications. Patient is taking gabapentin 300 mg at night with no side effects and we'll add 100 mg a.m. dosing and 100 mg p.m. dosing. In addition this we discussed over-the-counter medications that could be beneficial. If patient continues to have pain and does not respond well to osteopathic manipulation she would be a candidate for epidural steroid injections. Patient as well as husband will discuss in greater detail.  Spent greater than 25 minutes with patient face-to-face and had greater than 50% of counseling including as described above in assessment and plan.

## 2013-08-24 ENCOUNTER — Encounter: Payer: Self-pay | Admitting: Family Medicine

## 2013-08-24 ENCOUNTER — Ambulatory Visit (INDEPENDENT_AMBULATORY_CARE_PROVIDER_SITE_OTHER): Payer: Medicare Other | Admitting: Family Medicine

## 2013-08-24 VITALS — BP 112/80 | HR 82 | Ht 64.0 in | Wt 117.0 lb

## 2013-08-24 DIAGNOSIS — M48062 Spinal stenosis, lumbar region with neurogenic claudication: Secondary | ICD-10-CM

## 2013-08-24 DIAGNOSIS — M533 Sacrococcygeal disorders, not elsewhere classified: Secondary | ICD-10-CM

## 2013-08-24 DIAGNOSIS — M999 Biomechanical lesion, unspecified: Secondary | ICD-10-CM

## 2013-08-24 MED ORDER — GABAPENTIN 300 MG PO CAPS: ORAL_CAPSULE | ORAL | Status: DC

## 2013-08-24 NOTE — Progress Notes (Signed)
  Tawana ScaleZach Smith D.O. [facility] [address] [place], [place] [address] Phone: [phone] Subjective:       CC: back pain si joint dysfunction.   HQI:ONGEXBMWUXHPI:Subjective Gaia B Etheleen MayhewFuquay is a 80 y.o. female coming in with complaint of back pain. Patient has known history of degenerative joint disease and degenerative disc disease of the lumbar spine. Patient does have known spinal stenosis as well. Patient states she has not had her radiation to her legs as much of this time. Patient has been doing the exercises fairly regularly. Patient denies any numbness or tingling. Patient states that actually this last month his but she is felt and a very long time. Patient continues to work outside in be able to do all her activities daily living without any trouble. Patient continues he over-the-counter medication and has been feeling relatively well. No new symptoms.  Past medical history, social, surgical and family history all reviewed in electronic medical record.   Review of Systems: No headache, visual changes, nausea, vomiting, diarrhea, constipation, dizziness, abdominal pain, skin rash, fevers, chills, night sweats, weight loss, swollen lymph nodes, body aches, joint swelling, muscle aches, chest pain, shortness of breath, mood changes.   Objective Blood pressure 112/80, pulse 82, height 5\' 4"  (1.626 m), weight 117 lb (53.071 kg), SpO2 95.00%.  General: No apparent distress alert and oriented x3 mood and affect normal, dressed appropriately. Somewhat frail HEENT: Pupils equal, extraocular movements intact  Respiratory: Patient's speak in full sentences and does not appear short of breath  Cardiovascular: No lower extremity edema, non tender, no erythema  Skin: Warm dry intact with no signs of infection or rash on extremities or on axial skeleton.  Abdomen: Soft nontender  Neuro: Cranial nerves II through XII are intact, neurovascularly intact in all extremities with 2+ DTRs and 2+ pulses.    Lymph: No lymphadenopathy of posterior or anterior cervical chain or axillae bilaterally.  Gait normal with good balance and coordination.  MSK:  Non tender with full range of motion and good stability and symmetric strength and tone of shoulders, elbows, wrist, hip, knee and ankles bilaterally.  Back Exam:  Inspection: Mild increase in kyphosis Motion: Flexion 40 deg, Extension 25 deg, Side Bending to 35 deg bilaterally,  Rotation to 35 deg bilaterally  SLR laying: Negative  XSLR laying: Negative  Palpable tenderness:  Still moderately tender over the left SI joint FABER: Positive left patient does have more range of motion. Sensory change: Gross sensation intact to all lumbar and sacral dermatomes.  Reflexes: 2+ at both patellar tendons, 2+ at achilles tendons, Babinski's downgoing.  Strength at foot  Plantar-flexion: 5/5 Dorsi-flexion: 5/5 Eversion: 5/5 Inversion: 5/5  Leg strength  Quad: 5/5 Hamstring: 4/5 Hip flexor: 4/5 Hip abductors: 3/5  Gait shows mild broad-based gait Osteopathic findings of the sacrum rotated left on left    Impression and Recommendations:     This case required medical decision making of moderate complexity.

## 2013-08-24 NOTE — Assessment & Plan Note (Signed)
Seems to be fairly well controlled at this time. Patient is any worsening of neurologic symptoms we will address further.

## 2013-08-24 NOTE — Assessment & Plan Note (Signed)
Decision today to treat with OMT was based on Physical Exam  After verbal consent patient was treated with ME, indirect techniques in sacral areas  Patient tolerated the procedure well with improvement in symptoms  Patient given exercises, stretches and lifestyle modifications  See medications in patient instructions if given  Patient will follow up in 6 weeks

## 2013-08-24 NOTE — Assessment & Plan Note (Signed)
Patient continues to respond well to manipulation therapy and conservative therapy. Patient was given phase II exercises which I think we will be very beneficial. Patient did respond again to osteopathic manipulation today. The patient continues to do well we're going to space out to every 6 weeks for followup. Patient continued home exercises.  Spent greater than 25 minutes with patient face-to-face and had greater than 50% of counseling including as described above in assessment and plan.

## 2013-08-24 NOTE — Patient Instructions (Signed)
Exercises 3 times a week.  Continue the medications.   Gabapentin 100mg  in PM and 300mg  at night.

## 2013-08-26 ENCOUNTER — Emergency Department (HOSPITAL_BASED_OUTPATIENT_CLINIC_OR_DEPARTMENT_OTHER)
Admission: EM | Admit: 2013-08-26 | Discharge: 2013-08-26 | Disposition: A | Discharge status: Home / Self Care | Payer: Medicare Other | Attending: Emergency Medicine | Admitting: Emergency Medicine

## 2013-08-26 ENCOUNTER — Emergency Department (HOSPITAL_BASED_OUTPATIENT_CLINIC_OR_DEPARTMENT_OTHER): Payer: Medicare Other

## 2013-08-26 ENCOUNTER — Encounter (HOSPITAL_BASED_OUTPATIENT_CLINIC_OR_DEPARTMENT_OTHER): Payer: Self-pay | Admitting: Emergency Medicine

## 2013-08-26 DIAGNOSIS — S199XXA Unspecified injury of neck, initial encounter: Secondary | ICD-10-CM

## 2013-08-26 DIAGNOSIS — S060X0A Concussion without loss of consciousness, initial encounter: Secondary | ICD-10-CM | POA: Insufficient documentation

## 2013-08-26 DIAGNOSIS — Z8709 Personal history of other diseases of the respiratory system: Secondary | ICD-10-CM | POA: Insufficient documentation

## 2013-08-26 DIAGNOSIS — I10 Essential (primary) hypertension: Secondary | ICD-10-CM | POA: Insufficient documentation

## 2013-08-26 DIAGNOSIS — M129 Arthropathy, unspecified: Secondary | ICD-10-CM | POA: Insufficient documentation

## 2013-08-26 DIAGNOSIS — Y929 Unspecified place or not applicable: Secondary | ICD-10-CM | POA: Insufficient documentation

## 2013-08-26 DIAGNOSIS — Z9889 Other specified postprocedural states: Secondary | ICD-10-CM | POA: Insufficient documentation

## 2013-08-26 DIAGNOSIS — W1809XA Striking against other object with subsequent fall, initial encounter: Secondary | ICD-10-CM | POA: Insufficient documentation

## 2013-08-26 DIAGNOSIS — Z7982 Long term (current) use of aspirin: Secondary | ICD-10-CM | POA: Insufficient documentation

## 2013-08-26 DIAGNOSIS — Z79899 Other long term (current) drug therapy: Secondary | ICD-10-CM | POA: Insufficient documentation

## 2013-08-26 DIAGNOSIS — S0993XA Unspecified injury of face, initial encounter: Secondary | ICD-10-CM | POA: Insufficient documentation

## 2013-08-26 DIAGNOSIS — E785 Hyperlipidemia, unspecified: Secondary | ICD-10-CM | POA: Insufficient documentation

## 2013-08-26 DIAGNOSIS — Y93K1 Activity, walking an animal: Secondary | ICD-10-CM | POA: Insufficient documentation

## 2013-08-26 DIAGNOSIS — W010XXA Fall on same level from slipping, tripping and stumbling without subsequent striking against object, initial encounter: Secondary | ICD-10-CM | POA: Insufficient documentation

## 2013-08-26 NOTE — ED Notes (Signed)
Pt was walking her dog this morning and tripped and fell, hitting her head on a cement driveway. Pt denies LOC but husband sts she seems confused since the fall. Pt c/o head hematoma and neck pain.

## 2013-08-26 NOTE — ED Provider Notes (Signed)
CSN: [id number]     Arrival date & time 08/26/13  0945 History   None    Chief Complaint  Patient presents with  . Fall     (Consider location/radiation/quality/duration/timing/severity/associated sxs/prior Treatment) HPI Comments: Patient presents to the ER for evaluation after a fall. Patient fell while walking her dog. She thinks she stepped in a small causing the fall. There was no loss of consciousness. Patient complains of a large bump on the back of her head. She had complained of a headache earlier, but now states that it feels better. She has some minor pain across the back of the neck. No pain in the extremities. She has a very small abrasion of the left wrist but no pain associated. Husband says that she seemed confused after the fall but this is improving.  Patient is a 80 y.o. female presenting with fall.  Fall Associated symptoms include headaches.    Past Medical History  Diagnosis Date  . Hyperlipidemia   . Hypertension   . Allergy   . Arthritis    Past Surgical History  Procedure Laterality Date  . Aorta surgery  03/03/2010   Family History  Problem Relation Age of Onset  . Heart disease Mother   . Heart disease Father    History  Substance Use Topics  . Smoking status: Never Smoker   . Smokeless tobacco: Never Used  . Alcohol Use: Yes     Comment: socially   OB History   Grav Para Term Preterm Abortions TAB SAB Ect Mult Living                 Review of Systems  Musculoskeletal: Positive for neck pain.  Neurological: Positive for headaches.  Psychiatric/Behavioral: Positive for confusion.  All other systems reviewed and are negative.     Allergies  Review of patient's allergies indicates no known allergies.  Home Medications   Prior to Admission medications   Medication Sig Start Date End Date Taking? Authorizing Provider  aspirin 81 MG tablet Take 81 mg by mouth daily.    Historical Provider, MD  calcium acetate (PHOSLO) 667 MG capsule  Take 667 mg by mouth 3 (three) times daily with meals.    Historical Provider, MD  cetirizine (ZYRTEC) 10 MG tablet Take 10 mg by mouth daily.    Historical Provider, MD  CRESTOR 20 MG tablet Take 20 mg by mouth daily. 06/14/12   Historical Provider, MD  cyclobenzaprine (FLEXERIL) 5 MG tablet Take 5 mg by mouth daily.    Historical Provider, MD  fish oil-omega-3 fatty acids 1000 MG capsule Take 2 g by mouth daily.    Historical Provider, MD  gabapentin (NEURONTIN) 100 MG capsule 100mg  in AM and in PM then 300mg  at night. 08/03/13   Judi SaaZachary M Smith, DO  gabapentin (NEURONTIN) 300 MG capsule nightly 08/24/13   Judi SaaZachary M Smith, DO  HYDROcodone-acetaminophen (NORCO/VICODIN) 5-325 MG per tablet Take 1 tablet by mouth as needed for moderate pain.    Historical Provider, MD  lisinopril-hydrochlorothiazide (PRINZIDE,ZESTORETIC) 10-12.5 MG per tablet Take 10-12.5 tablets by mouth daily. 05/13/12   Historical Provider, MD  Multiple Vitamin (MULTIVITAMIN) capsule Take 1 capsule by mouth daily.    Historical Provider, MD   BP 159/84  Pulse 89  Temp(Src) 98.8 F (37.1 C) (Oral)  Resp 20  Ht 5\' 4"  (1.626 m)  Wt 117 lb (53.071 kg)  BMI 20.07 kg/m2  SpO2 95% Physical Exam  Constitutional: She is oriented to person, place, and  time. She appears well-developed and well-nourished. No distress.  HENT:  Head: Normocephalic. Head is with contusion.    Right Ear: Hearing normal.  Left Ear: Hearing normal.  Nose: Nose normal.  Mouth/Throat: Oropharynx is clear and moist and mucous membranes are normal.  Eyes: Conjunctivae and EOM are normal. Pupils are equal, round, and reactive to light.  Neck: Normal range of motion. Neck supple.  Cardiovascular: Regular rhythm, S1 normal and S2 normal.  Exam reveals no gallop and no friction rub.   No murmur heard. Pulmonary/Chest: Effort normal and breath sounds normal. No respiratory distress. She exhibits no tenderness.  Abdominal: Soft. Normal appearance and bowel  sounds are normal. There is no hepatosplenomegaly. There is no tenderness. There is no rebound, no guarding, no tenderness at McBurney's point and negative Murphy's sign. No hernia.  Musculoskeletal: Normal range of motion.  Neurological: She is alert and oriented to person, place, and time. She has normal strength. No cranial nerve deficit or sensory deficit. Coordination normal. GCS eye subscore is 4. GCS verbal subscore is 5. GCS motor subscore is 6.  Skin: Skin is warm, dry and intact. No rash noted. No cyanosis.     Psychiatric: She has a normal mood and affect. Her speech is normal and behavior is normal. Thought content normal.    ED Course  Procedures (including critical care time) Labs Review Labs Reviewed - No data to display  Imaging Review No results found.   EKG Interpretation None      MDM   Final diagnoses:  None   concussion  Patient presents to the ER for evaluation after a fall. Patient has a large contusion on the back of the head. She did not think she lost consciousness, but was dazed after the fall. Her complaint was headache as well as some mild neck tenderness and pain. CT head and cervical spine were negative. No other injuries other than a very tiny scratch on her hand without any pain. Patient was back to her baseline here in the ER. No focal neurologic findings. She is appropriate for discharge followup with primary doctor. Return to you for any further episodes of confusion or worsening headache.    Gilda Creasehristopher J. Pollina, MD 08/26/13 1130

## 2013-08-26 NOTE — Discharge Instructions (Signed)
Concussion °A concussion is a brain injury. It is caused by: °· A hit to the head. °· A quick and sudden movement (jolt) of the head or neck. °A concussion is usually not life-threatening. Even so, it can cause serious problems. If you had a concussion before, you may have concussion-like problems after a hit to your head. °HOME CARE °General Instructions °· Follow your doctor's directions carefully. °· Take medicines only as told by your doctor. °· Only take medicines your doctor says are safe. °· Do not drink alcohol until your doctor says it is okay. Alcohol and some drugs can slow down healing. They can also put you at risk for further injury. °· If you are having trouble remembering things, write them down. °· Try to do one thing at a time if you get distracted easily. For example, do not watch TV while making dinner. °· Talk to your family members or close friends when making important decisions. °· Follow up with your doctor as told. °· Watch your symptoms. Tell others to do the same. Serious problems can sometimes happen after a concussion. Older adults are more likely to have these problems. °· Tell your teachers, school nurse, school counselor, coach, athletic trainer, or work manager about your concussion. Tell them about what you can or cannot do. They should watch to see if: °¨ It gets even harder for you to pay attention or concentrate. °¨ It gets even harder for you to remember things or learn new things. °¨ You need more time than normal to finish things. °¨ You become annoyed (irritable) more than before. °¨ You are not able to deal with stress as well. °¨ You have more problems than before. °· Rest. Make sure you: °¨ Get plenty of sleep at night. °¨ Go to sleep early. °¨ Go to bed at the same time every day. Try to wake up at the same time. °¨ Rest during the day. °¨ Take naps when you feel tired. °· Limit activities where you have to think a lot or concentrate. These include: °¨ Doing  homework. °¨ Doing work related to a job. °¨ Watching TV. °¨ Using the computer. °Returning To Your Regular Activities °Return to your normal activities slowly, not all at once. You must give your body and brain enough time to heal.  °· Do not play sports or do other athletic activities until your doctor says it is okay. °· Ask your doctor when you can drive, ride a bicycle, or work other vehicles or machines. Never do these things if you feel dizzy. °· Ask your doctor about when you can return to work or school. °Preventing Another Concussion °It is very important to avoid another brain injury, especially before you have healed. In rare cases, another injury can lead to permanent brain damage, brain swelling, or death. The risk of this is greatest during the first 7-10 days after your injury. Avoid injuries by:  °· Wearing a seat belt when riding in a car. °· Not drinking too much alcohol. °· Avoiding activities that could lead to a second concussion (such as contact sports). °· Wearing a helmet when doing activities like: °¨ Biking. °¨ Skiing. °¨ Skateboarding. °¨ Skating. °· Making your home safer by: °¨ Removing things from the floor or stairways that could make you trip. °¨ Using grab bars in bathrooms and handrails by stairs. °¨ Placing non-slip mats on floors and in bathtubs. °¨ Improve lighting in dark areas. °GET HELP IF: °· It gets   even harder for you to pay attention or concentrate. °· It gets even harder for you to remember things or learn new things. °· You need more time than normal to finish things. °· You become annoyed (irritable) more than before. °· You are not able to deal with stress as well. °· You have more problems than before. °· You have problems keeping your balance. °· You are not able to react quickly when you should. °Get help if you have any of these problems for more than 2 weeks:  °· Lasting (chronic) headaches. °· Dizziness or trouble balancing. °· Feeling sick to your stomach  (nausea). °· Seeing (vision) problems. °· Being affected by noises or light more than normal. °· Feeling sad, low, down in the dumps, blue, gloomy, or empty (depressed). °· Mood changes (mood swings). °· Feeling of fear or nervousness about what may happen (anxiety). °· Feeling annoyed. °· Memory problems. °· Problems concentrating or paying attention. °· Sleep problems. °· Feeling tired all the time. °GET HELP RIGHT AWAY IF:  °· You have bad headaches or your headaches get worse. °· You have weakness (even if it is in one hand, leg, or part of the face). °· You have loss of feeling (numbness). °· You feel off balance. °· You keep throwing up (vomiting). °· You feel tired. °· One black center of your eye (pupil) is larger than the other. °· You twitch or shake violently (convulse). °· Your speech is not clear (slurred). °· You are more confused, easily angered (agitated), or annoyed than before. °· You have more trouble resting than before. °· You are unable to recognize people or places. °· You have neck pain. °· It is difficult to wake you up. °· You have unusual behavior changes. °· You pass out (lose consciousness). °MAKE SURE YOU:  °· Understand these instructions. °· Will watch your condition. °· Will get help right away if you are not doing well or get worse. °Document Released: 02/05/2009 Document Revised: 02/22/2013 Document Reviewed: 09/09/2012 °[employer]® Patient Information ©2015 [employer]. This information is not intended to replace advice given to you by your health care provider. Make sure you discuss any questions you have with your health care provider. ° °

## 2013-09-01 ENCOUNTER — Other Ambulatory Visit: Payer: Self-pay | Admitting: *Deleted

## 2013-09-01 DIAGNOSIS — R55 Syncope and collapse: Secondary | ICD-10-CM

## 2013-09-06 ENCOUNTER — Encounter: Payer: Self-pay | Admitting: *Deleted

## 2013-09-06 ENCOUNTER — Encounter (INDEPENDENT_AMBULATORY_CARE_PROVIDER_SITE_OTHER): Payer: Medicare Other

## 2013-09-06 DIAGNOSIS — R55 Syncope and collapse: Secondary | ICD-10-CM

## 2013-09-06 NOTE — Progress Notes (Signed)
Patient ID: [name], female   DOB: 10/28/1931, 80 y.o.   MRN: [id number] E-Cardio 48 hour holter monitor applied to patient.

## 2013-09-08 ENCOUNTER — Ambulatory Visit: Payer: Medicare Other

## 2013-09-13 ENCOUNTER — Other Ambulatory Visit: Payer: Self-pay | Admitting: *Deleted

## 2013-09-13 DIAGNOSIS — R55 Syncope and collapse: Secondary | ICD-10-CM

## 2013-09-26 ENCOUNTER — Encounter: Payer: Self-pay | Admitting: Family Medicine

## 2013-09-26 ENCOUNTER — Ambulatory Visit (INDEPENDENT_AMBULATORY_CARE_PROVIDER_SITE_OTHER): Payer: Medicare Other | Admitting: Family Medicine

## 2013-09-26 VITALS — BP 142/78 | HR 83 | Ht 64.0 in | Wt 117.0 lb

## 2013-09-26 DIAGNOSIS — R42 Dizziness and giddiness: Secondary | ICD-10-CM

## 2013-09-26 NOTE — Progress Notes (Signed)
  Tawana ScaleZach Beauden Tremont D.O. [facility] [address] [place], [place] [address] Phone: [phone] Subjective:     CC: back pain si joint dysfunction.   BJY:NWGNFAOZHYHPI:Subjective [name] is a 80 y.o. female coming in with complaint of back pain. Patient has known history of degenerative joint disease and degenerative disc disease of the lumbar spine. Patient does have known spinal stenosis as well.  Patient is doing relatively well one month ago and last time she was seen. Patient states though she did have a recent fall. Patient was seen in the emergency department one month ago on August 26, 2013. Patient was diagnosed with a concussion and without loss of consciousness. Patient did have a CT scan of her head which was negative and a CT of her neck that showed moderate to severe osteoarthritic changes. This was also reviewed by me and show significant calcific atherosclerotic changes of the carotid vessels. Patient states she continues to have difficulty especially when she puts her head in a flexed position and has passed out a couple times in the company of family. Patient is becoming less and less sure on her feet. Patient is accompanied with daughter who is very concerned of these different changes. Patient denies any weakness in any of her extremities. No complaints of back pain. Patient continues to live alone and is able to do all activities of daily living.   Pastmedical history, social, surgical and family history all reviewed in electronic medical record.   Review of Systems: No headache, visual changes, nausea, vomiting, diarrhea, constipation, dizziness, abdominal pain, skin rash, fevers, chills, night sweats, weight loss, swollen lymph nodes, body aches, joint swelling, muscle aches, chest pain, shortness of breath, mood changes.   Objective Blood pressure 142/78, pulse 83, height 5\' 4"  (1.626 m), weight 117 lb (53.071 kg), SpO2 96.00%.  General: No apparent distress alert and  oriented x3 mood and affect normal, dressed appropriately. Somewhat frail HEENT: Pupils equal, extraocular movements intact  Respiratory: Patient's speak in full sentences and does not appear short of breath  Cardiovascular: No lower extremity edema noted. Patient has regular rate and rhythm. Patient though does have a very large carotid bruit noted on the right carotid. Skin: Warm dry intact with no signs of infection or rash on extremities or on axial skeleton.  Abdomen: Soft nontender  Neuro: Cranial nerves II through XII are intact, neurovascularly intact in all extremities with 2+ DTRs and 2+ pulses.  Lymph: No lymphadenopathy of posterior or anterior cervical chain or axillae bilaterally.  Gait normal with good balance and coordination.  MSK:  Non tender with full range of motion and good stability and symmetric strength and tone of shoulders, elbows, wrist, hip, knee and ankles bilaterally.  Back Exam:  Inspection: Mild increase in kyphosis Motion: Flexion 40 deg, Extension 25 deg, Side Bending to 35 deg bilaterally,  Rotation to 35 deg bilaterally  SLR laying: Negative  XSLR laying: Negative  Palpable tenderness:  Still moderately tender over the left SI joint FABER: Positive left patient does have more range of motion. Sensory change: Gross sensation intact to all lumbar and sacral dermatomes.  Reflexes: 2+ at both patellar tendons, 2+ at achilles tendons, Babinski's downgoing.  Did not do rest of strength testing secondary to focusing on patient's cardiovascular findings. t    Impression and Recommendations:     This case required medical decision making of moderate complexity.

## 2013-09-26 NOTE — Patient Instructions (Signed)
Good to see you Decrease the neurontin to 100mg  at night.  Flexeril to half a pill at night.  We will get doppler of the neck. I will call with the results.  If worsening symptoms go to ER.  Or call and we will get MRI/MRA of neck and head.  Come back next week and we will see what going on.

## 2013-09-26 NOTE — Assessment & Plan Note (Signed)
Patient continues to have dizziness and symptoms of lightheadedness. Patient does have a carotid bruit of the right carotid that is somewhat concerning. Patient did recently see cardiology and did have a Holter monitor for 48 hours and final results are pending. I did review this. Patient's review of her Holter monitor did show a couple signs of supraventricular tachycardia but this was not lasting first more than several seconds. In addition to this patient's symptoms are more of postural especially when she flexes her neck down which makes it more concerning for the carotid. 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 Patient is on an aspirin daily at this time. Patient is also on a statin as well as ACE inhibitor. Differential also includes patient's gabapentin 300 mg and will decrease to 100 mg. We discussed cutting patient's Flexeril and half as well. Patient will be coming back next week for further evaluation and treatment. We will discuss findings of carotid Doppler over the phone. Patient has any worsening symptoms patient's daughter knows to take her to the emergency department immediately for further evaluation.  Spent greater than 25 minutes with patient face-to-face and had greater than 50% of counseling including as described above in assessment and plan.

## 2013-09-27 ENCOUNTER — Ambulatory Visit (HOSPITAL_BASED_OUTPATIENT_CLINIC_OR_DEPARTMENT_OTHER)
Admission: RE | Admit: 2013-09-27 | Discharge: 2013-09-27 | Disposition: A | Discharge status: Home / Self Care | Payer: Medicare Other | Source: Ambulatory Visit | Attending: Family Medicine | Admitting: Family Medicine

## 2013-09-27 DIAGNOSIS — R42 Dizziness and giddiness: Secondary | ICD-10-CM | POA: Diagnosis present

## 2013-09-27 DIAGNOSIS — I6529 Occlusion and stenosis of unspecified carotid artery: Secondary | ICD-10-CM | POA: Diagnosis not present

## 2013-09-28 ENCOUNTER — Other Ambulatory Visit: Payer: Self-pay | Admitting: *Deleted

## 2013-09-28 DIAGNOSIS — Z87898 Personal history of other specified conditions: Secondary | ICD-10-CM

## 2013-09-28 DIAGNOSIS — R51 Headache: Secondary | ICD-10-CM

## 2013-09-28 DIAGNOSIS — R42 Dizziness and giddiness: Secondary | ICD-10-CM

## 2013-10-05 ENCOUNTER — Ambulatory Visit: Payer: Medicare Other | Admitting: Family Medicine

## 2013-10-08 ENCOUNTER — Ambulatory Visit
Admission: RE | Admit: 2013-10-08 | Discharge: 2013-10-08 | Disposition: A | Discharge status: Home / Self Care | Payer: Medicare Other | Source: Ambulatory Visit | Attending: Family Medicine | Admitting: Family Medicine

## 2013-10-08 DIAGNOSIS — Z87898 Personal history of other specified conditions: Secondary | ICD-10-CM

## 2013-10-08 DIAGNOSIS — R51 Headache: Secondary | ICD-10-CM

## 2013-10-08 DIAGNOSIS — R42 Dizziness and giddiness: Secondary | ICD-10-CM

## 2013-10-08 MED ORDER — GADOBENATE DIMEGLUMINE 529 MG/ML IV SOLN
10.0000 mL | Freq: Once | INTRAVENOUS | Status: AC | PRN
  Administered 2013-10-08: 10 mL via INTRAVENOUS

## 2013-10-21 ENCOUNTER — Ambulatory Visit (INDEPENDENT_AMBULATORY_CARE_PROVIDER_SITE_OTHER): Payer: Medicare Other | Admitting: Family Medicine

## 2013-10-21 ENCOUNTER — Encounter: Payer: Self-pay | Admitting: Family Medicine

## 2013-10-21 VITALS — BP 110/62 | HR 79 | Ht 64.0 in | Wt 117.0 lb

## 2013-10-21 DIAGNOSIS — R42 Dizziness and giddiness: Secondary | ICD-10-CM

## 2013-10-21 DIAGNOSIS — M533 Sacrococcygeal disorders, not elsewhere classified: Secondary | ICD-10-CM

## 2013-10-21 DIAGNOSIS — M999 Biomechanical lesion, unspecified: Secondary | ICD-10-CM

## 2013-10-21 MED ORDER — FLUTICASONE PROPIONATE 50 MCG/ACT NA SUSP: 2.0000 | Freq: Every day | NASAL | Status: DC

## 2013-10-21 NOTE — Assessment & Plan Note (Signed)
Patient did respond well to osteopathic manipulation again today. We discussed an icing regimen continuing home exercises regularly. Patient is going to come back again in 3-4 weeks for further evaluation. Patient continues respond osteopathic manipulation we will keep this on a regular regimen. Discussed over-the-counter medicines that could be beneficial.

## 2013-10-21 NOTE — Progress Notes (Signed)
  Tawana ScaleZach Smith D.O. [facility] [address] [place], [place] [address] Phone: [phone] Subjective:       CC: back pain si joint dysfunction. Followup   OZH:YQMVHQIONGHPI:Subjective [name] is a 80 y.o. female coming in with complaint of back pain. Patient has known history of degenerative joint disease and degenerative disc disease of the lumbar spine. Patient is having some exacerbation of the pain. Patient continues with the gabapentin and Norco for pain relief. Patient states it is mostly on the right side and seems to be significantly the same as it's been. No significant radiation down her legs at this time. The weakness that she can note.  Patient continues to have some of the dizzy spells but does seem to respond fairly well to the meclizine. Patient has noticed though she takes the meclizine and zyrtec at the same time she can have difficulty with concentration.  Past medical history, social, surgical and family history all reviewed in electronic medical record.   Review of Systems: No headache, visual changes, nausea, vomiting, diarrhea, constipation, dizziness, abdominal pain, skin rash, fevers, chills, night sweats, weight loss, swollen lymph nodes, body aches, joint swelling, muscle aches, chest pain, shortness of breath, mood changes.   Objective Blood pressure 110/62, pulse 79, height 5\' 4"  (1.626 m), weight 117 lb (53.071 kg), SpO2 94.00%.  General: No apparent distress alert and oriented x3 mood and affect normal, dressed appropriately. Somewhat frail HEENT: Pupils equal, extraocular movements intact  Respiratory: Patient's speak in full sentences and does not appear short of breath  Cardiovascular: No lower extremity edema, non tender, no erythema  Skin: Warm dry intact with no signs of infection or rash on extremities or on axial skeleton.  Abdomen: Soft nontender  Neuro: Cranial nerves II through XII are intact, neurovascularly intact in all extremities with 2+  DTRs and 2+ pulses.  Lymph: No lymphadenopathy of posterior or anterior cervical chain or axillae bilaterally.  Gait normal with good balance and coordination.  MSK:  Non tender with full range of motion and good stability and symmetric strength and tone of shoulders, elbows, wrist, hip, knee and ankles bilaterally.  Back Exam:  Inspection: Mild increase in kyphosis Motion: Flexion 40 deg, Extension 25 deg, Side Bending to 35 deg bilaterally,  Rotation to 35 deg bilaterally  SLR laying: Negative  XSLR laying: Negative  Palpable tenderness:  Still moderately tender over the left SI joint FABER: Positive left patient does have more range of motion. Sensory change: Gross sensation intact to all lumbar and sacral dermatomes.  Reflexes: 2+ at both patellar tendons, 2+ at achilles tendons, Babinski's downgoing.  Strength at foot  Plantar-flexion: 5/5 Dorsi-flexion: 5/5 Eversion: 5/5 Inversion: 5/5  Leg strength  Quad: 5/5 Hamstring: 4/5 Hip flexor: 4/5 Hip abductors: 3/5  Gait shows mild broad-based gait Osteopathic findings of the sacrum rotated left on left    Impression and Recommendations:     This case required medical decision making of moderate complexity.

## 2013-10-21 NOTE — Patient Instructions (Addendum)
Good to see you [name] is your friend Start flonase daily.  This will help with the inner ear. Use 1 times daily.  Zyrtec in AM and consider meclizine in PM  Try some of the back exercises 3 times a week.  See me again in 4 weeks.

## 2013-10-21 NOTE — Assessment & Plan Note (Signed)
Patient does continue to have a dizziness but workup including carotid Dopplers as well as MRI/MRA of the brain was unremarkable. I do believe that we have to look more at the inner ear. We discussed the possibility of referral to ENT which patient declined. Patient is going to come back and see me again in 3-4 weeks. In the interim I am going to have patient start in corticosteroidin case this is from full dose of the inner ear. Warned of potential side effects. Patient and will discuss other changes if necessary.  Spent greater than 25 minutes with patient face-to-face and had greater than 50% of counseling including as described above in assessment and plan.

## 2013-10-24 ENCOUNTER — Other Ambulatory Visit: Payer: Self-pay | Admitting: Endocrinology

## 2013-10-24 DIAGNOSIS — Z1231 Encounter for screening mammogram for malignant neoplasm of breast: Secondary | ICD-10-CM

## 2013-10-25 ENCOUNTER — Ambulatory Visit: Payer: Medicare Other

## 2013-10-25 ENCOUNTER — Ambulatory Visit (INDEPENDENT_AMBULATORY_CARE_PROVIDER_SITE_OTHER): Payer: Medicare Other

## 2013-10-25 DIAGNOSIS — Z1231 Encounter for screening mammogram for malignant neoplasm of breast: Secondary | ICD-10-CM

## 2013-12-06 ENCOUNTER — Encounter: Payer: Self-pay | Admitting: Family Medicine

## 2013-12-06 ENCOUNTER — Ambulatory Visit (INDEPENDENT_AMBULATORY_CARE_PROVIDER_SITE_OTHER): Payer: Medicare Other | Admitting: Family Medicine

## 2013-12-06 VITALS — BP 120/80 | HR 80 | Ht 64.0 in | Wt 118.0 lb

## 2013-12-06 DIAGNOSIS — M4806 Spinal stenosis, lumbar region: Secondary | ICD-10-CM

## 2013-12-06 DIAGNOSIS — M48062 Spinal stenosis, lumbar region with neurogenic claudication: Secondary | ICD-10-CM

## 2013-12-06 DIAGNOSIS — M9904 Segmental and somatic dysfunction of sacral region: Secondary | ICD-10-CM

## 2013-12-06 DIAGNOSIS — M533 Sacrococcygeal disorders, not elsewhere classified: Secondary | ICD-10-CM

## 2013-12-06 DIAGNOSIS — M999 Biomechanical lesion, unspecified: Secondary | ICD-10-CM

## 2013-12-06 NOTE — Progress Notes (Signed)
  Tawana ScaleZach Roylene Heaton D.O. [facility] [address] [place], [place] [address] Phone: [phone] Subjective:    CC: back pain si joint dysfunction. Followup   LKG:MWNUUVOZDGHPI:Subjective Donna B Etheleen MayhewFuquay is a 80 y.o. female coming in with complaint of back pain. Patient has known history of degenerative joint disease and degenerative disc disease of the lumbar spine. 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 Patient states that she is having her daily aches and pains in his seem to be worsening a little bit. Patient states whenever she has the osteopathic manipulation it does seem to improve for some time. Patient states that she's not having any radicular symptoms down the leg and states that she is walking better than usual. Patient continues to try to go to therapy at her assisted living facility. Denies any new symptoms. Only some mild worsening of previous symptoms.  Patient's dizziness that she was having previously has improved with the Flonase she states. States that it is significantly less frequent. Patient would like to remain on this medicine and denies any side effects.  Past medical history, social, surgical and family history all reviewed in electronic medical record.   Review of Systems: No headache, visual changes, nausea, vomiting, diarrhea, constipation, dizziness, abdominal pain, skin rash, fevers, chills, night sweats, weight loss, swollen lymph nodes, body aches, joint swelling, muscle aches, chest pain, shortness of breath, mood changes.   Objective Blood pressure 120/80, pulse 80, height 5\' 4"  (1.626 m), weight 118 lb (53.524 kg), SpO2 98.00%.  General: No apparent distress alert and oriented x3 mood and affect normal, dressed appropriately. Somewhat frail HEENT: Pupils equal, extraocular movements intact  Respiratory: Patient's speak in full sentences and does not appear short of breath  Cardiovascular: No lower extremity edema, non tender, no erythema  Skin: Warm dry  intact with no signs of infection or rash on extremities or on axial skeleton.  Abdomen: Soft nontender  Neuro: Cranial nerves II through XII are intact, neurovascularly intact in all extremities with 2+ DTRs and 2+ pulses.  Lymph: No lymphadenopathy of posterior or anterior cervical chain or axillae bilaterally.  Gait normal with good balance and coordination.  MSK:  Non tender with full range of motion and good stability and symmetric strength and tone of shoulders, elbows, wrist, hip, knee and ankles bilaterally.  Back Exam:  Inspection: Mild increase in kyphosis Motion: Flexion 40 deg, Extension 25 deg, Side Bending to 35 deg bilaterally,  Rotation to 35 deg bilaterally  SLR laying: Negative  XSLR laying: Negative  Palpable tenderness:  Still moderately tender on left side FABER: Continues to be positive on left Sensory change: Gross sensation intact to all lumbar and sacral dermatomes.  Reflexes: 2+ at both patellar tendons, 2+ at achilles tendons, Babinski's downgoing.  Strength at foot  Plantar-flexion: 5/5 Dorsi-flexion: 5/5 Eversion: 5/5 Inversion: 5/5  Leg strength  Quad: 5/5 Hamstring: 4/5 Hip flexor: 4/5 Hip abductors: 3/5  Gait improved gait Osteopathic findings of the sacrum rotated left on left    Impression and Recommendations:     This case required medical decision making of moderate complexity.

## 2013-12-06 NOTE — Patient Instructions (Signed)
You are doing great overall.  Continue the exercises 3 times a week and I love your classes.  Ice when you need it.  Com eback in 4-6 weeks.

## 2013-12-06 NOTE — Assessment & Plan Note (Signed)
Decision today to treat with OMT was based on Physical Exam  After verbal consent patient was treated with ME, indirect techniques in sacral areas  Patient tolerated the procedure well with improvement in symptoms  Patient given exercises, stretches and lifestyle modifications  See medications in patient instructions if given  Patient will follow up in 3-4 weeks keeping some increased frequency to avoid any exacerbations.

## 2013-12-06 NOTE — Assessment & Plan Note (Signed)
Continues to respond well to conservative therapy. Encourage her to try the topical medicine as well as the over-the-counter medications. We discussed how ice can be beneficial. Patient is going to try these different interventions and come back again in 3-4 weeks for further evaluation and as long as osteopathic manipulation continues to help will continue with this treatment option.  Spent greater than 25 minutes with patient face-to-face and had greater than 50% of counseling including as described above in assessment and plan.

## 2014-01-24 ENCOUNTER — Ambulatory Visit: Payer: Medicare Other | Admitting: Family Medicine

## 2014-01-31 ENCOUNTER — Encounter: Payer: Self-pay | Admitting: Family Medicine

## 2014-01-31 ENCOUNTER — Ambulatory Visit (INDEPENDENT_AMBULATORY_CARE_PROVIDER_SITE_OTHER): Payer: Medicare Other | Admitting: Family Medicine

## 2014-01-31 VITALS — BP 128/80 | HR 69 | Ht 64.0 in | Wt 120.0 lb

## 2014-01-31 DIAGNOSIS — M533 Sacrococcygeal disorders, not elsewhere classified: Secondary | ICD-10-CM

## 2014-01-31 DIAGNOSIS — M999 Biomechanical lesion, unspecified: Secondary | ICD-10-CM

## 2014-01-31 DIAGNOSIS — M9904 Segmental and somatic dysfunction of sacral region: Secondary | ICD-10-CM

## 2014-01-31 NOTE — Assessment & Plan Note (Signed)
SI joint, continue to give her the trouble.  This will be a chronic problem. Encourage her to continue the exercises regularly. Patient was given a topical anti-inflammatory to try. We discussed icing protocol again. Patient declined formal physical therapy. Patient will follow-up on an as-needed basis.  Spent greater than 25 minutes with patient face-to-face and had greater than 50% of counseling including as described above in assessment and plan.

## 2014-01-31 NOTE — Assessment & Plan Note (Signed)
Decision today to treat with OMT was based on Physical Exam  After verbal consent patient was treated with ME, indirect techniques in sacral areas  Patient tolerated the procedure well with improvement in symptoms  Patient given exercises, stretches and lifestyle modifications  See medications in patient instructions if given  Follow up PRn

## 2014-01-31 NOTE — Progress Notes (Signed)
  [name] D.O. [facility] [address] [place], [place] [address] Phone: [phone] Subjective:    CC: back pain si joint dysfunction. Followup   NFA:OZHYQMVHQIHPI:Subjective [name] is a 80 y.o. female coming in with complaint of back pain. Patient has known history of degenerative joint disease and degenerative disc disease of the lumbar spine. 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 Patient was doing considerably better for some time and started having increasing pain again. States that there is some mild radicular symptoms. Patient states though that overall she is continuing to be able to do her daily activities. Denies any numbness or tingling or weakness. Just the radiation of pain from time to time. Patient does use an over-the-counter topical that seems to be helpful. Continues with the vitamins and does the exercises fairly regularly.  Past medical history, social, surgical and family history all reviewed in electronic medical record.   Review of Systems: No headache, visual changes, nausea, vomiting, diarrhea, constipation, dizziness, abdominal pain, skin rash, fevers, chills, night sweats, weight loss, swollen lymph nodes, body aches, joint swelling, muscle aches, chest pain, shortness of breath, mood changes.   Objective Blood pressure 128/80, pulse 69, height 5\' 4"  (1.626 m), weight 120 lb (54.432 kg).  General: No apparent distress alert and oriented x3 mood and affect normal, dressed appropriately. Somewhat frail HEENT: Pupils equal, extraocular movements intact  Respiratory: Patient's speak in full sentences and does not appear short of breath  Cardiovascular: No lower extremity edema, non tender, no erythema  Skin: Warm dry intact with no signs of infection or rash on extremities or on axial skeleton.  Abdomen: Soft nontender  Neuro: Cranial nerves II through XII are intact, neurovascularly intact in all extremities with 2+ DTRs and 2+ pulses.  Lymph: No  lymphadenopathy of posterior or anterior cervical chain or axillae bilaterally.  Gait normal with good balance and coordination.  MSK:  Non tender with full range of motion and good stability and symmetric strength and tone of shoulders, elbows, wrist, hip, knee and ankles bilaterally.  Back Exam:  Inspection: Mild increase in kyphosis Motion: Flexion 40 deg, Extension 25 deg, Side Bending to 35 deg bilaterally,  Rotation to 35 deg bilaterally  SLR laying: Negative  XSLR laying: Negative  Palpable tenderness:  Still moderately tender on left side FABER: Continues to be positive on left Sensory change: Gross sensation intact to all lumbar and sacral dermatomes.  Reflexes: 2+ at both patellar tendons, 2+ at achilles tendons, Babinski's downgoing.  Strength at foot  Plantar-flexion: 5/5 Dorsi-flexion: 5/5 Eversion: 5/5 Inversion: 5/5  Leg strength  Quad: 5/5 Hamstring: 4/5 Hip flexor: 4/5 Hip abductors: 3/5  Osteopathic findings of the sacrum rotated left on left    Impression and Recommendations:     This case required medical decision making of moderate complexity.

## 2014-01-31 NOTE — Patient Instructions (Signed)
Good to see you I am glad you are not dizzy For your back continue the exercises.  Also ice 10 minutes to the area at the end of the long day.  Try the pennsaid twice daily when you need it.  Continue the vitamins  See me when you need me.

## 2014-02-22 ENCOUNTER — Ambulatory Visit (INDEPENDENT_AMBULATORY_CARE_PROVIDER_SITE_OTHER): Payer: Medicare Other | Admitting: Family Medicine

## 2014-02-22 ENCOUNTER — Encounter: Payer: Self-pay | Admitting: Family Medicine

## 2014-02-22 VITALS — BP 122/82 | HR 70 | Ht 64.0 in | Wt 120.0 lb

## 2014-02-22 DIAGNOSIS — M999 Biomechanical lesion, unspecified: Secondary | ICD-10-CM

## 2014-02-22 DIAGNOSIS — M9902 Segmental and somatic dysfunction of thoracic region: Secondary | ICD-10-CM

## 2014-02-22 DIAGNOSIS — M9904 Segmental and somatic dysfunction of sacral region: Secondary | ICD-10-CM

## 2014-02-22 DIAGNOSIS — M9903 Segmental and somatic dysfunction of lumbar region: Secondary | ICD-10-CM

## 2014-02-22 DIAGNOSIS — M533 Sacrococcygeal disorders, not elsewhere classified: Secondary | ICD-10-CM

## 2014-02-22 MED ORDER — DICLOFENAC SODIUM 1 % TD GEL: 2.0000 g | Freq: Two times a day (BID) | TRANSDERMAL | Status: DC

## 2014-02-22 NOTE — Progress Notes (Signed)
  Tawana ScaleZach Smith D.O. [facility] [address] [place], [place] [address] Phone: [phone]([phone] Subjective:    CC: back pain si joint dysfunction. Followup   BJY:NWGNFAOZHYHPI:Subjective Donna B Etheleen MayhewFuquay is a 80 y.o. female coming in with complaint of back pain. Patient has known history of degenerative joint disease and degenerative disc disease of the lumbar spine. 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 Patient states that she feels that she is malaligned. There is having mild increase in discomfort. Patient does have spinal stenosis the discussed some weakness in her legs and his notices somewhat more. Patient continues to workout usually on a daily basis. Patient continues with all medications without any side effects. Overall patient feels like she is doing relatively well.  Past medical history, social, surgical and family history all reviewed in electronic medical record.   Review of Systems: No headache, visual changes, nausea, vomiting, diarrhea, constipation, dizziness, abdominal pain, skin rash, fevers, chills, night sweats, weight loss, swollen lymph nodes, body aches, joint swelling, muscle aches, chest pain, shortness of breath, mood changes.   Objective Blood pressure 122/82, pulse 70, height 5\' 4"  (1.626 m), weight 120 lb (54.432 kg), SpO2 5 %.  General: No apparent distress alert and oriented x3 mood and affect normal, dressed appropriately. Somewhat frail HEENT: Pupils equal, extraocular movements intact  Respiratory: Patient's speak in full sentences and does not appear short of breath  Cardiovascular: No lower extremity edema, non tender, no erythema  Skin: Warm dry intact with no signs of infection or rash on extremities or on axial skeleton.  Abdomen: Soft nontender  Neuro: Cranial nerves II through XII are intact, neurovascularly intact in all extremities with 2+ DTRs and 2+ pulses.  Lymph: No lymphadenopathy of posterior or anterior cervical chain or axillae  bilaterally.  Gait normal with good balance and coordination.  MSK:  Non tender with full range of motion and good stability and symmetric strength and tone of shoulders, elbows, wrist, hip, knee and ankles bilaterally.  Back Exam:  Inspection: Mild increase in kyphosis but stable from previous exam Motion: Flexion 350 deg, Extension 25 deg, Side Bending to 35 deg bilaterally,  Rotation to 25 deg bilaterally  SLR laying: Negative  XSLR laying: Negative  Palpable tenderness:  Still moderately tender on left side sacroiliac joint FABER: Continues to be positive on left Sensory change: Gross sensation intact to all lumbar and sacral dermatomes.  Reflexes: 2+ at both patellar tendons, 2+ at achilles tendons, Babinski's downgoing.  Strength at foot  Plantar-flexion: 5/5 Dorsi-flexion: 5/5 Eversion: 5/5 Inversion: 5/5  Leg strength  Quad: 5/5 Hamstring: 4/5 Hip flexor: 4/5 Hip abductors: 3/5   Osteopathic findings of the sacrum rotated left on left Thoracic T5-T9 group curve rotated right side bent left Lumbar L2 flexed rotated and side bent right   Impression and Recommendations:     This case required medical decision making of moderate complexity.

## 2014-02-22 NOTE — Assessment & Plan Note (Addendum)
Decision today to treat with OMT was based on Physical Exam  After verbal consent patient was treated with ME, indirect techniques in thoracic, lumbar sacral areas  Patient tolerated the procedure well with improvement in symptoms  Patient given exercises, stretches and lifestyle modifications  See medications in patient instructions if given  Follow up 4-6 weeks

## 2014-02-22 NOTE — Assessment & Plan Note (Signed)
Continues to be patient's main complaint. We discussed icing regimen and continuing the home exercises. Patient was showed proper technique again today. Patient was given up her prescription for topical anti-inflammatory which I think will be safer than oral anti-inflammatories in this individual. We discussed proper shoes. Patient will continue to workout classes and we discussed strengthening of legs. Patient come back again in 4-6 weeks for further evaluation and treatment.  Spent greater than 25 minutes with patient face-to-face and had greater than 50% of counseling including as described above in assessment and plan.

## 2014-02-22 NOTE — Patient Instructions (Signed)
Good to see you Ice is your friend Try the voltaren gel up to 2 times daily.  If I get a sample I will call you.   Continue the arnica.  Conitnue the exercises See me again in 4-6 weeks.

## 2014-03-15 ENCOUNTER — Encounter: Payer: Self-pay | Admitting: Family Medicine

## 2014-03-15 ENCOUNTER — Ambulatory Visit (INDEPENDENT_AMBULATORY_CARE_PROVIDER_SITE_OTHER): Payer: Medicare Other | Admitting: Family Medicine

## 2014-03-15 VITALS — BP 128/54 | HR 82 | Ht 64.0 in | Wt 122.0 lb

## 2014-03-15 DIAGNOSIS — R42 Dizziness and giddiness: Secondary | ICD-10-CM

## 2014-03-15 NOTE — Patient Instructions (Signed)
Good to see you New exercises for the hamstring.  Stop the blood pressure medicine Decrease neurontin 200mg  at night, if still dizzy then decrease to 100mg  at night the next week Start the nose spray regularly.  See me again in 2 weeks ot make sure you are doing better

## 2014-03-15 NOTE — Progress Notes (Signed)
Pre visit review using our clinic review tool, if applicable. No additional management support is needed unless otherwise documented below in the visit note. 

## 2014-03-15 NOTE — Progress Notes (Signed)
  Tawana ScaleZach Roxi Hlavaty D.O. [facility] [address] [place], [place] [address] Phone: [phone] Subjective:     CC: Balance and dizziness follow up  OZH:YQMVHQIONGHPI:Subjective Donna B Etheleen MayhewFuquay is a 81 y.o. female coming in  With dizziness     Patient back in June did have a fall and did have a concussion. Patient did have some dizziness and was put on the nose spray. Patient was doing somewhat better but recently has had increasing dizziness again.. Patient previously did have a CT scan of her head which was negative and a CT of her neck that showed moderate to severe osteoarthritic changes. This was also reviewed by me and show significant calcific atherosclerotic changes of the carotid vessels. Patient states she continues to have difficulty especially when she puts her head in a flexed position.  Patient was responding to a Flonase secondary to using it for the diagnosis of vertigo. Patient was doing much better but did stop this. Patient is wondering if she should continue it. Patient states going from a seated to standing position also gives her a feeling where she could potentially Passout.  No falls recently. Mild weakness in the legs.      Pastmedical history, social, surgical and family history all reviewed in electronic medical record.   Review of Systems: No headache, visual changes, nausea, vomiting, diarrhea, constipation, dizziness, abdominal pain, skin rash, fevers, chills, night sweats, weight loss, swollen lymph nodes, body aches, joint swelling, muscle aches, chest pain, shortness of breath, mood changes.   Objective Blood pressure 128/54, weight 122 lb (55.339 kg).  General: No apparent distress alert and oriented x3 mood and affect normal, dressed appropriately. Somewhat frail HEENT: Pupils equal, extraocular movements intact  Respiratory: Patient's speak in full sentences and does not appear short of breath  Cardiovascular: No lower extremity edema noted. Patient has regular  rate and rhythm. Patient though does have a very large carotid bruit noted on the right carotid. This is constant from previous exam. Skin: Warm dry intact with no signs of infection or rash on extremities or on axial skeleton.  Abdomen: Soft nontender  Neuro: Cranial nerves II through XII are intact, neurovascularly intact in all extremities with 2+ DTRs and 2+ pulses.  Lymph: No lymphadenopathy of posterior or anterior cervical chain or axillae bilaterally.  Gait normal with good balance and coordination.  MSK:  Non tender with full range of motion and good stability and symmetric strength and tone of shoulders, elbows, wrist, hip, knee and ankles bilaterally.  Back Exam:  Inspection: Mild increase in kyphosis Motion: Flexion 40 deg, Extension 25 deg, Side Bending to 35 deg bilaterally,  Rotation to 35 deg bilaterally  SLR laying: Negative  XSLR laying: Negative  Palpable tenderness:  Still moderately tender over the left SI joint FABER: Positive left patient does have more range of motion. Sensory change: Gross sensation intact to all lumbar and sacral dermatomes.  Reflexes: 2+ at both patellar tendons, 2+ at achilles tendons, Babinski's downgoing.  Mild dizziness with change positions likely secondary to benign positional vertigo    Impression and Recommendations:     This case required medical decision making of moderate complexity.

## 2014-03-15 NOTE — Assessment & Plan Note (Signed)
At this point I do think the patient was responding fairly well to the nose present we will continue this. With patient's age and size I do feel the gabapentin 300 mg at night could be concerning. Patient will decreased to 200 mg. This has been helping her back pain though significantly. In addition of this we discussed patient blood pressure medication and patient will do a one-week trial without it. Patient's blood pressure goes up to the systolic to 150 she will need to restart it. Patient will check a times weekly. In addition of this patient will start the nose present stated previously. Patient come back in 2 weeks for further evaluation and to make sure patient blood pressure is still under control. If patient continues to have difficulty further workup including cardiovascular risk factors needs to be done. Patient will bring labs from primary care provider and if continuing we need to check thyroid, B12 as well as folate and vitamin D.  Spent  25 minutes with patient face-to-face and had greater than 50% of counseling including as described above in assessment and plan.

## 2014-04-03 ENCOUNTER — Other Ambulatory Visit: Payer: Self-pay | Admitting: Endocrinology

## 2014-04-03 DIAGNOSIS — R42 Dizziness and giddiness: Secondary | ICD-10-CM

## 2014-04-12 ENCOUNTER — Other Ambulatory Visit: Payer: Medicare Other

## 2014-04-25 ENCOUNTER — Telehealth: Payer: Self-pay | Admitting: *Deleted

## 2014-04-25 ENCOUNTER — Ambulatory Visit (INDEPENDENT_AMBULATORY_CARE_PROVIDER_SITE_OTHER): Payer: Medicare Other | Admitting: Neurology

## 2014-04-25 ENCOUNTER — Encounter: Payer: Self-pay | Admitting: Neurology

## 2014-04-25 VITALS — BP 118/70 | HR 72 | Temp 98.2°F | Resp 16 | Ht 64.0 in | Wt 122.0 lb

## 2014-04-25 DIAGNOSIS — R42 Dizziness and giddiness: Secondary | ICD-10-CM

## 2014-04-25 DIAGNOSIS — M48062 Spinal stenosis, lumbar region with neurogenic claudication: Secondary | ICD-10-CM

## 2014-04-25 DIAGNOSIS — M4806 Spinal stenosis, lumbar region: Secondary | ICD-10-CM

## 2014-04-25 DIAGNOSIS — H538 Other visual disturbances: Secondary | ICD-10-CM

## 2014-04-25 NOTE — Progress Notes (Signed)
NEUROLOGY CONSULTATION NOTE  [name] MRN: [id number] DOB: [birth year]  Referring provider: Dr. Juleen China Primary care provider: Dr. Juleen China  Reason for consult:  Dizziness, blurred vision in right eye.  HISTORY OF PRESENT ILLNESS: [name] is an 81 year old right-handed woman with hypertension and hyperlipidemia who presents for dizziness and blurred vision in right eye.  Records, labs, and CT of head, cervical spine and MRA of head and neck reviewed.  She is accompanied by her husband and daughter who provide some history.  She had a concussion in June after a fall.  She simply fell backwards.  CT of the head showed chronic small vessel ischemic changes.  CT of cervical spine showed no fracture.  Since the fall, she has had episodes or recurrent dizziness.  She describes the dizziness as a sense of movement but no true spinning.  There is no associated nausea.  It is triggered when she turns or otherwise moves her head.  It lasts for just a couple of seconds.  She tried meclizine which didn't work well.  She had carotid doppler performed in July, which revealed no hemodynamically significant stenosis in the bilateral ICA and vertebral arteries.  MRA of the head and neck performed in August showed no hemodynamically significant intracranial or extracranial arterial stenosis.  About a month ago, she was sitting and eating dinner when she suddenly developed blurred vision only in the right eye.  There was no clouding or darkening of vision.  There was no associated headache, facial droop or focal numbness or weakness.  She saw the eye doctor who told her she had dry eyes and was prescribed steroid drops, which has helped.  Sometimes, it would still occur off and on but only briefly.  She was scheduled for an MRI of the brain performed on 04/12/14, but she cancelled it.  She also has a history of lumbar spinal stenosis with claudication.  She takes gabapentin for the left leg pain.  Labs  from October include TSH of 1.482.  She takes aspirin.  PAST MEDICAL HISTORY: Past Medical History  Diagnosis Date  . Hyperlipidemia   . Hypertension   . Allergy   . Arthritis     PAST SURGICAL HISTORY: Past Surgical History  Procedure Laterality Date  . Aorta surgery  03/03/2010    MEDICATIONS: Current Outpatient Prescriptions on File Prior to Visit  Medication Sig Dispense Refill  . aspirin 81 MG tablet Take 81 mg by mouth daily.    . calcium acetate (PHOSLO) 667 MG capsule Take 667 mg by mouth 3 (three) times daily with meals.    . cetirizine (ZYRTEC) 10 MG tablet Take 10 mg by mouth daily.    . CRESTOR 20 MG tablet Take 20 mg by mouth daily.    . cyclobenzaprine (FLEXERIL) 5 MG tablet Take 5 mg by mouth daily.    . diclofenac sodium (VOLTAREN) 1 % GEL Apply 2 g topically 2 (two) times daily. 100 g 3  . fish oil-omega-3 fatty acids 1000 MG capsule Take 2 g by mouth daily.    . fluticasone (FLONASE) 50 MCG/ACT nasal spray Place 2 sprays into both nostrils daily. 16 g 6  . gabapentin (NEURONTIN) 100 MG capsule  in AM and in PM then  at night. 150 capsule 3  . lisinopril-hydrochlorothiazide (PRINZIDE,ZESTORETIC) 10-12.5 MG per tablet Take 10-12.5 tablets by mouth daily.    . Multiple Vitamin (MULTIVITAMIN) capsule Take 1 capsule by mouth daily.    Marland Kitchen HYDROcodone-acetaminophen (  NORCO/VICODIN) 5-325 MG per tablet Take 1 tablet by mouth as needed for moderate pain.     No current facility-administered medications on file prior to visit.    ALLERGIES: No Known Allergies  FAMILY HISTORY: Family History  Problem Relation Age of Onset  . Heart disease Mother   . Heart disease Father     SOCIAL HISTORY: History   Social History  . Marital Status: Married    Spouse Name: N/A  . Number of Children: N/A  . Years of Education: N/A   Occupational History  . Not on file.   Social History Main Topics  . Smoking status: Never Smoker   . Smokeless tobacco: Never  Used  . Alcohol Use: Yes     Comment: socially  . Drug Use: No  . Sexual Activity: No   Other Topics Concern  . Not on file   Social History Narrative    REVIEW OF SYSTEMS: Constitutional: No fevers, chills, or sweats, no generalized fatigue, change in appetite Eyes: No visual changes, double vision, eye pain Ear, nose and throat: No hearing loss, ear pain, nasal congestion, sore throat Cardiovascular: No chest pain, palpitations Respiratory:  No shortness of breath at rest or with exertion, wheezes GastrointestinaI: No nausea, vomiting, diarrhea, abdominal pain, fecal incontinence Genitourinary:  No dysuria, urinary retention or frequency Musculoskeletal:  Back and left leg pain. Integumentary: No rash, pruritus, skin lesions Neurological: as above Psychiatric: No depression, insomnia, anxiety Endocrine: No palpitations, fatigue, diaphoresis, mood swings, change in appetite, change in weight, increased thirst Hematologic/Lymphatic:  No anemia, purpura, petechiae. Allergic/Immunologic: no itchy/runny eyes, nasal congestion, recent allergic reactions, rashes  PHYSICAL EXAM: Filed Vitals:   04/25/14 0809  BP: 118/70  Pulse: 72  Temp: 98.2 F (36.8 C)  Resp: 16   General: No acute distress Head:  Normocephalic/atraumatic Eyes:  fundi unremarkable, without vessel changes, exudates, hemorrhages or papilledema. Neck: supple, no paraspinal tenderness, full range of motion Back: mild left paraspinal tenderness Heart: regular rate and rhythm Lungs: Clear to auscultation bilaterally. Vascular: No carotid bruits. Neurological Exam: Mental status: alert and oriented to person, place, and time, recent and remote memory intact, fund of knowledge intact, attention and concentration intact, speech fluent and not dysarthric, language intact. Cranial nerves: CN I: not tested CN II: pupils equal, round and reactive to light, visual fields intact, fundi unremarkable, without vessel  changes, exudates, hemorrhages or papilledema. CN III, IV, VI:  full range of motion, no nystagmus, no ptosis CN V: facial sensation intact CN VII: upper and lower face symmetric CN VIII: hearing intact CN IX, X: gag intact, uvula midline CN XI: sternocleidomastoid and trapezius muscles intact CN XII: tongue midline Bulk & Tone: normal, no fasciculations. Motor:  5/5 throughout Sensation:  Reduced vibration in the toes of the left foot.  Pinprick sensation intact. Deep Tendon Reflexes:  2+ and symmetric in upper extremities.  3+ and symmetric in both patellars and right ankle.  Left ankle absent.  Toes downgoing. Finger to nose testing:  No dysmetria Heel to shin:  No dysmetria Gait:  Cautious, mildly wide-based gait but with good stride.  Difficulty to turn around due to dizziness.  Able to walk in tandem. Romberg negative.  IMPRESSION: Monocular blurred vision in right eye.  This is less likely to be related to stroke and more likely related to the eye.   Dizziness.  Likely residual post-concussive symptoms.   Gait instability.  May be related to spinal stenosis.  She doesn't really exhibit  signs of polyneuropathy.  She does have brisk reflexes in the legs, however this alone is not pathologic, but seems out of proportion for an 81 year old.  She has absent ankle reflex on the left, which likely is related to a radiculopathy.  PLAN: We will get the MRI of the brain We will refer to vestibular rehab We can always consider MRI of the cervical spine to look for spinal stenosis causing a myelopathy.  She would like to hold off for now.  Thank you for allowing me to take part in the care of this patient.  Shon MilletAdam Jaffe, DO  CC:  Adela Lankennis Kohut, MD

## 2014-04-25 NOTE — Telephone Encounter (Signed)
Daughter Clydie BraunKaren is aware [facility] at 12:45

## 2014-04-25 NOTE — Addendum Note (Signed)
Addended by: Glean SalenVAN DER GLAS, Hally Colella E on: 04/25/2014 10:59 AM   Modules accepted: Orders

## 2014-04-25 NOTE — Patient Instructions (Addendum)
1.  We will get MRI of the brain without contrast. We will call you with an appt.  2.  You have been referred to [facility] Rehab. They will call you directly to schedule an appointment.  Please call [phone] if you do not hear from them.  3.  We will contact you with results and any further testing that may be necessary.

## 2014-05-08 ENCOUNTER — Encounter: Payer: Self-pay | Admitting: Family Medicine

## 2014-05-08 ENCOUNTER — Ambulatory Visit (INDEPENDENT_AMBULATORY_CARE_PROVIDER_SITE_OTHER): Payer: Medicare Other | Admitting: Family Medicine

## 2014-05-08 VITALS — BP 110/72 | HR 79 | Ht 64.0 in | Wt 120.0 lb

## 2014-05-08 DIAGNOSIS — M9903 Segmental and somatic dysfunction of lumbar region: Secondary | ICD-10-CM | POA: Diagnosis not present

## 2014-05-08 DIAGNOSIS — M9902 Segmental and somatic dysfunction of thoracic region: Secondary | ICD-10-CM | POA: Diagnosis not present

## 2014-05-08 DIAGNOSIS — M4806 Spinal stenosis, lumbar region: Secondary | ICD-10-CM

## 2014-05-08 DIAGNOSIS — M48062 Spinal stenosis, lumbar region with neurogenic claudication: Secondary | ICD-10-CM

## 2014-05-08 DIAGNOSIS — M9904 Segmental and somatic dysfunction of sacral region: Secondary | ICD-10-CM

## 2014-05-08 DIAGNOSIS — M999 Biomechanical lesion, unspecified: Secondary | ICD-10-CM

## 2014-05-08 MED ORDER — METHYLPREDNISOLONE ACETATE 80 MG/ML IJ SUSP
80.0000 mg | Freq: Once | INTRAMUSCULAR | Status: AC
  Administered 2014-05-08: 80 mg via INTRAMUSCULAR

## 2014-05-08 MED ORDER — KETOROLAC TROMETHAMINE 60 MG/2ML IM SOLN
60.0000 mg | Freq: Once | INTRAMUSCULAR | Status: AC
Start: 2014-05-08 — End: 2014-05-08
  Administered 2014-05-08: 60 mg via INTRAMUSCULAR

## 2014-05-08 NOTE — Assessment & Plan Note (Signed)
I believe the patient is having more of a exacerbation patient spinal stenosis causing a left-sided lumbar radiculopathy. I do think that patient could respond to prednisone the patient elected to have injections instead of Depo-Medrol as well as Toradol. We discussed icing regimen and the home exercises. Patient has gone a follow-up with me again in 1 week. We discussed the possibility of increasing patient's gabapentin but patient has already had some difficulty with dizziness and giddiness and makes it a concern. We do not want to use any other pain medications at this time. Patient will try to make these changes and if we do not have any significant improvement we may need to consider another advance imaging to see if there is any progression. Patient knows that if muscle weakness occurs or bowel or bladder incontinence to seek medical attention immediately.

## 2014-05-08 NOTE — Progress Notes (Signed)
Pre visit review using our clinic review tool, if applicable. No additional management support is needed unless otherwise documented below in the visit note. 

## 2014-05-08 NOTE — Progress Notes (Signed)
  Tawana ScaleZach Glenda Spelman D.O. [facility] [address] [place], [place] [address] Phone: [phone] Subjective:    CC: back pain si joint dysfunction. Followup   ION:GEXBMWUXLKHPI:Subjective [name] is a 81 y.o. female coming in with complaint of back pain. Patient has known history of degenerative joint disease and degenerative disc disease of the lumbar spine. 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 Patient has been seen before for osteopathic manipulation. Patient is also been taking gabapentin 200 mg at night. Patient states she is actually taking 300 mg a gabapentin at night. Patient states that this is feeling taking that seems to help with some of her pain. Patient states that it is radiating down her left leg. Seems to be more constant than it usually is. Not stopping him from any activities but makes it extremely sore. Patient does have known spinal stenosis last viewed September 2014 MRI.  Past medical history, social, surgical and family history all reviewed in electronic medical record.   Review of Systems: No headache, visual changes, nausea, vomiting, diarrhea, constipation, dizziness, abdominal pain, skin rash, fevers, chills, night sweats, weight loss, swollen lymph nodes, body aches, joint swelling, muscle aches, chest pain, shortness of breath, mood changes.   Objective Blood pressure 110/72, pulse 79, height 5\' 4"  (1.626 m), weight 120 lb (54.432 kg), SpO2 93 %.  General: No apparent distress alert and oriented x3 mood and affect normal, dressed appropriately. Somewhat frail HEENT: Pupils equal, extraocular movements intact  Respiratory: Patient's speak in full sentences and does not appear short of breath  Cardiovascular: No lower extremity edema, non tender, no erythema  Skin: Warm dry intact with no signs of infection or rash on extremities or on axial skeleton.  Abdomen: Soft nontender  Neuro: Cranial nerves II through XII are intact, neurovascularly intact in all  extremities with 2+ DTRs and 2+ pulses.  Lymph: No lymphadenopathy of posterior or anterior cervical chain or axillae bilaterally.  Gait normal with good balance and coordination.  MSK:  Non tender with full range of motion and good stability and symmetric strength and tone of shoulders, elbows, wrist, hip, knee and ankles bilaterally.  Back Exam:  Inspection: Mild increase in kyphosis but stable from previous exam Motion: Flexion 350 deg, Extension 25 deg, Side Bending to 35 deg bilaterally,  Rotation to 25 deg bilaterally  SLR laying: Positive left side XSLR laying: Negative  Palpable tenderness:  Still moderately tender on left side sacroiliac joint FABER: Continues to be positive on left Sensory change: Gross sensation intact to all lumbar and sacral dermatomes.  Reflexes: 2+ at both patellar tendons, 2+ at achilles tendons, Babinski's downgoing.  Strength at foot  Plantar-flexion: 5/5 Dorsi-flexion: 5/5 Eversion: 5/5 Inversion: 5/5  Leg strength  Quad: 5/5 Hamstring: 4/5 Hip flexor: 4/5 Hip abductors: 3/5   Osteopathic findings of the sacrum rotated left on left Thoracic T5-T9 group curve rotated right side bent left Lumbar L2 flexed rotated and side bent right   Impression and Recommendations:     This case required medical decision making of moderate complexity.

## 2014-05-08 NOTE — Assessment & Plan Note (Signed)
Decision today to treat with OMT was based on Physical Exam  After verbal consent patient was treated with ME, indirect techniques in thoracic, lumbar sacral areas  Patient tolerated the procedure well with improvement in symptoms  Patient given exercises, stretches and lifestyle modifications  See medications in patient instructions if given  Follow up 1-2 weeks

## 2014-05-08 NOTE — Patient Instructions (Signed)
Good to see you Ice is your friend 2 injections today Continue the gabapentin See me again in 2 weeks or call me sooner if worsening or not a lot better.

## 2014-05-12 ENCOUNTER — Ambulatory Visit (HOSPITAL_COMMUNITY)
Admission: RE | Admit: 2014-05-12 | Discharge: 2014-05-12 | Disposition: A | Discharge status: Home / Self Care | Payer: Medicare Other | Source: Ambulatory Visit | Attending: Neurology | Admitting: Neurology

## 2014-05-12 DIAGNOSIS — R42 Dizziness and giddiness: Secondary | ICD-10-CM | POA: Diagnosis not present

## 2014-05-12 DIAGNOSIS — H538 Other visual disturbances: Secondary | ICD-10-CM | POA: Diagnosis present

## 2014-05-12 DIAGNOSIS — M4806 Spinal stenosis, lumbar region: Secondary | ICD-10-CM | POA: Diagnosis not present

## 2014-05-12 DIAGNOSIS — M48062 Spinal stenosis, lumbar region with neurogenic claudication: Secondary | ICD-10-CM

## 2014-05-15 ENCOUNTER — Telehealth: Payer: Self-pay | Admitting: *Deleted

## 2014-05-15 NOTE — Telephone Encounter (Signed)
Left message on voice mail unremarkable MRI results

## 2014-05-15 NOTE — Telephone Encounter (Signed)
-----   Message from Drema DallasAdam R Jaffe, DO sent at 05/12/2014  4:37 PM EST ----- MRI is unremarkable ----- Message -----    From: Rad Results In Interface    Sent: 05/12/2014   3:04 PM      To: Drema DallasAdam R Jaffe, DO

## 2014-05-17 ENCOUNTER — Other Ambulatory Visit: Payer: Self-pay | Admitting: *Deleted

## 2014-05-17 DIAGNOSIS — R42 Dizziness and giddiness: Secondary | ICD-10-CM

## 2014-05-17 DIAGNOSIS — M48062 Spinal stenosis, lumbar region with neurogenic claudication: Secondary | ICD-10-CM

## 2014-05-29 ENCOUNTER — Ambulatory Visit: Payer: Medicare Other | Admitting: Neurology

## 2014-06-26 ENCOUNTER — Encounter: Payer: Self-pay | Admitting: Family Medicine

## 2014-06-26 ENCOUNTER — Ambulatory Visit (INDEPENDENT_AMBULATORY_CARE_PROVIDER_SITE_OTHER): Payer: Medicare Other | Admitting: Family Medicine

## 2014-06-26 ENCOUNTER — Other Ambulatory Visit (INDEPENDENT_AMBULATORY_CARE_PROVIDER_SITE_OTHER): Payer: Medicare Other

## 2014-06-26 VITALS — BP 132/80 | HR 81 | Ht 64.0 in | Wt 120.0 lb

## 2014-06-26 DIAGNOSIS — M533 Sacrococcygeal disorders, not elsewhere classified: Secondary | ICD-10-CM

## 2014-06-26 NOTE — Progress Notes (Signed)
Pre visit review using our clinic review tool, if applicable. No additional management support is needed unless otherwise documented below in the visit note. 

## 2014-06-26 NOTE — Assessment & Plan Note (Signed)
Patient was given an injection today. I'm concerned than patient is also having more of a lumbar radicular symptoms that is giving her some neurogenic claudication. Patient was given the sacroiliac joint injection we'll see if this makes significant improvement. Increasing patient's gabapentin 200 mg twice daily in 300 mg at night. Patient continue with all other conservative therapy. Patient will call by the end of this week. If worsening symptoms I would consider an epidural for diagnostic as well as therapeutic purposes. This would be at the L5-S1 level. For follow-up in the near future.  Spent  25 minutes with patient face-to-face and had greater than 50% of counseling including as described above in assessment and plan.

## 2014-06-26 NOTE — Patient Instructions (Signed)
Good to see you Start the gabapentin 100mg  in AM, and 100mg  in PM and 300mg  at night Ice is your friend Call me in 24 hours and if not a lot better we will consider epidural.  If you get better see me when you need me.

## 2014-06-26 NOTE — Progress Notes (Signed)
Tawana ScaleZach Bitha Fauteux D.O. [facility] [address] [place], [place] [address] Phone: [phone] Subjective:    CC: back pain si joint dysfunction. Followup   BJY:NWGNFAOZHYHPI:Subjective [name] is a 81 y.o. female coming in with complaint of back pain. Patient has known history of degenerative joint disease and degenerative disc disease of the lumbar spine. 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 . Patient states she is actually taking 300 mg a gabapentin at night. Patient has had worsening symptoms recently with some pain radiating down her leg. Patient states that it is becoming more and more difficulty doing daily activities such as walking up or down stairs. Patient states that she is feeling like this is starting to become debilitating. Denies significant weakness but states that it is extremely painful. Patient does have known spinal stenosis last viewed September 2014 MRI.  Past medical history, social, surgical and family history all reviewed in electronic medical record.   Review of Systems: No headache, visual changes, nausea, vomiting, diarrhea, constipation, dizziness, abdominal pain, skin rash, fevers, chills, night sweats, weight loss, swollen lymph nodes, body aches, joint swelling, muscle aches, chest pain, shortness of breath, mood changes.   Objective Blood pressure 132/80, pulse 81, height 5\' 4"  (1.626 m), weight 120 lb (54.432 kg), SpO2 97 %.  General: No apparent distress alert and oriented x3 mood and affect normal, dressed appropriately. Somewhat frail HEENT: Pupils equal, extraocular movements intact  Respiratory: Patient's speak in full sentences and does not appear short of breath  Cardiovascular: No lower extremity edema, non tender, no erythema  Skin: Warm dry intact with no signs of infection or rash on extremities or on axial skeleton.  Abdomen: Soft nontender  Neuro: Cranial nerves II through XII are intact, neurovascularly intact in all extremities with  2+ DTRs and 2+ pulses.  Lymph: No lymphadenopathy of posterior or anterior cervical chain or axillae bilaterally.  Gait normal with good balance and coordination.  MSK:  Non tender with full range of motion and good stability and symmetric strength and tone of shoulders, elbows, wrist, hip, knee and ankles bilaterally.  Back Exam:  Inspection: Mild increase in kyphosis but stable from previous exam Motion: Flexion 350 deg, Extension 25 deg, Side Bending to 35 deg bilaterally,  Rotation to 25 deg bilaterally  SLR laying: Positive left side XSLR laying: Negative  Palpable tenderness:  Severely tender over the left side sacroiliac joint FABER: Continues to be positive on left Sensory change: Gross sensation intact to all lumbar and sacral dermatomes.  Reflexes: 2+ at both patellar tendons, 2+ at achilles tendons, Babinski's downgoing.  Strength at foot  Plantar-flexion: 5/5 Dorsi-flexion: 5/5 Eversion: 5/5 Inversion: 5/5  Leg strength  Quad: 5/5 Hamstring: 4/5 Hip flexor: 4/5 Hip abductors: 3/5  Procedure: Real-time Ultrasound Guided Injection of left sacroiliac joint. Device: GE Logiq E  Ultrasound guided injection is preferred based studies that show increased duration, increased effect, greater accuracy, decreased procedural pain, increased response rate, and decreased cost with ultrasound guided versus blind injection.  Verbal informed consent obtained.  Time-out conducted.  Noted no overlying erythema, induration, or other signs of local infection.  Skin prepped in a sterile fashion.  Local anesthesia: Topical Ethyl chloride.  With sterile technique and under real time ultrasound guidance: With a 22-gauge 3-1/2 inch needle patient was injected under ultrasound guidance with 2 mL of 0.5% Marcaine and 1 mL of Kenalog 40 mg/dL into the left sacroiliac joint.   Completed without difficulty  Pain immediately resolved  suggesting accurate placement of the medication.  Advised to call if  fevers/chills, erythema, induration, drainage, or persistent bleeding.  Images permanently stored and available for review in the ultrasound unit.  Impression: Technically successful ultrasound guided injection.    Impression and Recommendations:     This case required medical decision making of moderate complexity.

## 2014-06-27 ENCOUNTER — Ambulatory Visit: Payer: Medicare Other | Admitting: Family Medicine

## 2014-08-15 ENCOUNTER — Ambulatory Visit (INDEPENDENT_AMBULATORY_CARE_PROVIDER_SITE_OTHER): Payer: Medicare Other | Admitting: Family Medicine

## 2014-08-15 ENCOUNTER — Encounter: Payer: Self-pay | Admitting: Family Medicine

## 2014-08-15 VITALS — BP 130/82 | HR 75 | Ht 64.0 in | Wt 118.0 lb

## 2014-08-15 DIAGNOSIS — M4806 Spinal stenosis, lumbar region: Secondary | ICD-10-CM

## 2014-08-15 DIAGNOSIS — M533 Sacrococcygeal disorders, not elsewhere classified: Secondary | ICD-10-CM

## 2014-08-15 DIAGNOSIS — M48062 Spinal stenosis, lumbar region with neurogenic claudication: Secondary | ICD-10-CM

## 2014-08-15 NOTE — Assessment & Plan Note (Signed)
Patient's underlying problem of spinal stenosis that think is also given her some neurogenic claudication. We discussed the possibility of an epidural cervical injection which patient would like to hold at this time. We discussed icing regimen as well as the home exercises again and we also discussed protein supplementation of this could be beneficial. We discussed icing regimen and patient will come back and see me again in 3 weeks. At that time we will discuss either repeat sacroiliac joint injection versus possible epidurals if patient is still having pain.

## 2014-08-15 NOTE — Assessment & Plan Note (Signed)
Discussed continue the exercises Ice is your friend What activity to avoid.  RTC in 2 weeks then will consider repeat injection.

## 2014-08-15 NOTE — Patient Instructions (Signed)
Great to see you [name] is your friend. Especially at night Iron 65mg  3 times a week Muscle milk light 1-2 times a day Continue to stay active See me again in 3 weeks and if sacrum hurts we will do another injection then 2 weeks after that will consider epidural.

## 2014-08-15 NOTE — Progress Notes (Signed)
Pre visit review using our clinic review tool, if applicable. No additional management support is needed unless otherwise documented below in the visit note. 

## 2014-08-15 NOTE — Progress Notes (Signed)
  [facility] [address] [place], [place] [address] Phone: [phone] Subjective:    CC: back pain si joint dysfunction. Followup   YYT:[id number] [name] is a 81 y.o. female coming in with complaint of back pain. Patient has known history of degenerative joint disease and degenerative disc disease of the lumbar spine. 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 . Patient states she is actually taking 300 mg a gabapentin at night. Patient was last seen 2 months ago and after failing manipulation patient was given a sacroiliac joint injection into the left sacroiliac joint. Patient was to continue with conservative therapies. Patient states overall she has been doing relatively well. Patient states though that unfortunate she has a numbness that goes down her left leg starting at about 3 PM and has severe enough pain at night that she is unable to walk after about 6 PM. Patient states though then she goes to sleep and in the morning she is feeling completely pain-free. Patient states that the gabapentin she is taken 100 mg at lunch and then 300 mg at night. Denies any significant weakness or any feelings of falling. Patient cannot tolerate any higher doses of the gabapentin even though this seems helps. Patient states that the injection of the sacroiliac joint did help for approximately 2 weeks and the pain started to come back again.  Past medical history, social, surgical and family history all reviewed in electronic medical record.   Review of Systems: No headache, visual changes, nausea, vomiting, diarrhea, constipation, dizziness, abdominal pain, skin rash, fevers, chills, night sweats, weight loss, swollen lymph nodes, body aches, joint swelling, muscle aches, chest pain, shortness of breath, mood changes.   Objective Blood pressure 130/82, pulse 75, height 5\' 4"  (1.626 m), weight 118 lb (53.524 kg), SpO2 97 %.  General: No apparent distress alert and  oriented x3 mood and affect normal, dressed appropriately. Somewhat frail HEENT: Pupils equal, extraocular movements intact  Respiratory: Patient's speak in full sentences and does not appear short of breath  Cardiovascular: No lower extremity edema, non tender, no erythema  Skin: Warm dry intact with no signs of infection or rash on extremities or on axial skeleton.  Abdomen: Soft nontender  Neuro: Cranial nerves II through XII are intact, neurovascularly intact in all extremities with 2+ DTRs and 2+ pulses.  Lymph: No lymphadenopathy of posterior or anterior cervical chain or axillae bilaterally.  Gait normal with good balance and coordination.  MSK:  Non tender with full range of motion and good stability and symmetric strength and tone of shoulders, elbows, wrist, hip, knee and ankles bilaterally.  Back Exam:  Inspection: Mild increase in kyphosis but stable from previous exam Motion: Flexion 350 deg, Extension 25 deg, Side Bending to 35 deg bilaterally,  Rotation to 25 deg bilaterally  SLR laying: Minorly positive left but improved XSLR laying: Negative  Palpable tenderness:  Mild to moderate tender over the left-sided sacroiliac joint  FABER: Continues to be positive on left Sensory change: Gross sensation intact to all lumbar and sacral dermatomes.  Reflexes: 2+ at both patellar tendons, 2+ at achilles tendons, Babinski's downgoing.  Strength at foot  Plantar-flexion: 5/5 Dorsi-flexion: 5/5 Eversion: 5/5 Inversion: 5/5  Leg strength  Quad: 5/5 Hamstring: 4/5 Hip flexor: 4/5 Hip abductors: 3+/5     Impression and Recommendations:     This case required medical decision making of moderate complexity.

## 2014-08-30 ENCOUNTER — Telehealth: Payer: Self-pay | Admitting: Family Medicine

## 2014-08-30 NOTE — Telephone Encounter (Signed)
Patient wanted to make an appt because she is in pain. There is no access until 09/13/2014 at this point. She requests to be fit in next Wednesday if possible.

## 2014-08-30 NOTE — Telephone Encounter (Signed)
Spoke to pt, scheduled her for 7.1.16 @ 12pm.

## 2014-09-01 ENCOUNTER — Other Ambulatory Visit (INDEPENDENT_AMBULATORY_CARE_PROVIDER_SITE_OTHER): Payer: Medicare Other

## 2014-09-01 ENCOUNTER — Ambulatory Visit (INDEPENDENT_AMBULATORY_CARE_PROVIDER_SITE_OTHER)
Admission: RE | Admit: 2014-09-01 | Discharge: 2014-09-01 | Disposition: A | Discharge status: Home / Self Care | Payer: Medicare Other | Source: Ambulatory Visit | Attending: Family Medicine | Admitting: Family Medicine

## 2014-09-01 ENCOUNTER — Ambulatory Visit (INDEPENDENT_AMBULATORY_CARE_PROVIDER_SITE_OTHER): Payer: Medicare Other | Admitting: Family Medicine

## 2014-09-01 ENCOUNTER — Encounter: Payer: Self-pay | Admitting: Family Medicine

## 2014-09-01 VITALS — BP 122/74 | HR 86 | Ht 64.0 in | Wt 120.0 lb

## 2014-09-01 DIAGNOSIS — M533 Sacrococcygeal disorders, not elsewhere classified: Secondary | ICD-10-CM | POA: Diagnosis not present

## 2014-09-01 DIAGNOSIS — M48062 Spinal stenosis, lumbar region with neurogenic claudication: Secondary | ICD-10-CM

## 2014-09-01 DIAGNOSIS — M4806 Spinal stenosis, lumbar region: Secondary | ICD-10-CM

## 2014-09-01 NOTE — Patient Instructions (Addendum)
Good ot see you Lets get new xrays downstairs today.  Ice is your friend Continue exerything else Call me tuesday and if still in pain we need to do new MRI and then epidural.  Happy 4th!

## 2014-09-01 NOTE — Progress Notes (Signed)
Pre visit review using our clinic review tool, if applicable. No additional management support is needed unless otherwise documented below in the visit note. 

## 2014-09-01 NOTE — Progress Notes (Signed)
[name], [name] Phone: [phone] Subjective:    CC: back pain si joint dysfunction. Followup   Donna:VHQIONGEXB Carrera B Morris is a 81 y.o. female coming in with complaint of back pain. Patient has known history of degenerative joint disease and degenerative disc disease of the lumbar spine. 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 . Patient states she is actually taking 100 mg in morning and afternoon and 300 mg a gabapentin at night. Patient does have known spinal stenosis last viewed September 2014 MRI. Patient is continuing to have pain. Patient unfortunately states that actually the pain seems to be worsening. Seems to be radiating down her leg still on the posterior lateral aspect. An states that he can go all the way to her foot now. If she walks greater than 15 minutes or stands for greater than 15 minutes she feels unstable on her feet. Would not call it weakness though. Patient states though that the pain is unrelenting and normal brings her to tears.  Past medical history, social, surgical and family history all reviewed in electronic medical record.   Review of Systems: No headache, visual changes, nausea, vomiting, diarrhea, constipation, dizziness, abdominal pain, skin rash, fevers, chills, night sweats, weight loss, swollen lymph nodes, body aches, joint swelling, muscle aches, chest pain, shortness of breath, mood changes.   Objective Blood pressure 122/74, pulse 86, height  (1.626 m), weight 120 lb (54.432 kg), SpO2 95 %.  General: No apparent distress alert and oriented x3 mood and affect normal, dressed appropriately. Somewhat frail HEENT: Pupils equal, extraocular movements intact  Respiratory: Patient's speak in full sentences and does not appear short of breath  Cardiovascular: No lower extremity edema, non tender, no erythema  Skin: Warm dry intact with no signs of infection or rash on  extremities or on axial skeleton.  Abdomen: Soft nontender  Neuro: Cranial nerves II through XII are intact, neurovascularly intact in all extremities with 2+ DTRs and 2+ pulses.  Lymph: No lymphadenopathy of posterior or anterior cervical chain or axillae bilaterally.  Gait more antalgic gait and usual MSK:  Non tender with full range of motion and good stability and symmetric strength and tone of shoulders, elbows, wrist, hip, knee and ankles bilaterally.  Back Exam:  Inspection: Mild increase in kyphosis but stable from previous exam Motion: Flexion 350 deg, Extension 25 deg, Side Bending to 35 deg bilaterally,  Rotation to 25 deg bilaterally  SLR laying: Positive left side XSLR laying: Negative  Palpable tenderness:  Severely tender over the left side sacroiliac joint FABER: Difficult to assess because patient is even more painful than usual. Sensory change: Gross sensation intact to all lumbar and sacral dermatomes.  Reflexes: 2+ at both patellar tendons, 2+ at achilles tendons, Babinski's downgoing.  Strength at foot  Plantar-flexion: 5/5 Dorsi-flexion: 5/5 Eversion: 5/5 Inversion: 5/5  Leg strength  Quad: 5/5 Hamstring: 4/5 Hip flexor: 4/5 Hip abductors: 3/5 on left side compared to full strength on right side.  Procedure: Real-time Ultrasound Guided Injection of left sacroiliac joint. Device: GE Logiq E  Ultrasound guided injection is preferred based studies that show increased duration, increased effect, greater accuracy, decreased procedural pain, increased response rate, and decreased cost with ultrasound guided versus blind injection.  Verbal informed consent obtained.  Time-out conducted.  Noted no overlying erythema, induration, or other signs of local infection.  Skin prepped in a sterile fashion.  Local anesthesia: Topical Ethyl chloride.  With  sterile technique and under real time ultrasound guidance: With a 22-gauge 3-1/2 inch needle patient was injected under  ultrasound guidance with 2 mL of 0.5% Marcaine and 1 mL of Kenalog 40 mg/dL into the left sacroiliac joint.   Completed without difficulty  Pain immediately resolved suggesting accurate placement of the medication.  Advised to call if fevers/chills, erythema, induration, drainage, or persistent bleeding.  Images permanently stored and available for review in the ultrasound unit.  Impression: Technically successful ultrasound guided injection.    Impression and Recommendations:     This case required medical decision making of moderate complexity.

## 2014-09-01 NOTE — Assessment & Plan Note (Signed)
Do believe that this is likely the underlying cause. Repeat x-rays ordered today and there is good chance we will MRI in the near future. Patient does have a straight leg test and does have some weakness on the left side compared to the right side which is concerning. Patient elected try conservative therapy but we'll call after the weekend if continuing pain advance imaging will be ordered. Patient knows if any worsening symptoms, talar bladder incontinence she is to seek medical attention immediately.

## 2014-09-01 NOTE — Assessment & Plan Note (Signed)
Given another injection today. Patient tolerated the procedure very well. We discussed increasing her gabapentin to 200 mg in a.m., 200 mg and 300 mg at night. We discussed icing and patient does have muscle relaxers needed. Patient continues to have pain after the weekend and I would consider repeat imaging due to pain and I think there is a possibility for more of a lumbar radiculopathy causing some of this. Patient does have known spinal stenosis and epidural steroidal injections may be necessary.

## 2014-09-05 ENCOUNTER — Other Ambulatory Visit: Payer: Self-pay | Admitting: *Deleted

## 2014-09-05 ENCOUNTER — Telehealth: Payer: Self-pay | Admitting: Family Medicine

## 2014-09-05 DIAGNOSIS — M533 Sacrococcygeal disorders, not elsewhere classified: Secondary | ICD-10-CM

## 2014-09-05 DIAGNOSIS — M5416 Radiculopathy, lumbar region: Secondary | ICD-10-CM

## 2014-09-05 NOTE — Telephone Encounter (Signed)
Discussed with patient's daughter. Patient did have an injection the sacroiliac joint. States that it is moderately improved but not helping the significant pain that she is having in the back as well as the radicular symptoms. Patient would like to go ahead and get an MRI of the lumbar spine. This help us decide if an epidural would be warranted. This was ordered today.

## 2014-09-08 ENCOUNTER — Ambulatory Visit: Payer: Medicare Other | Admitting: Family Medicine

## 2014-09-14 ENCOUNTER — Ambulatory Visit
Admission: RE | Admit: 2014-09-14 | Discharge: 2014-09-14 | Disposition: A | Discharge status: Home / Self Care | Payer: Medicare Other | Source: Ambulatory Visit | Attending: Family Medicine | Admitting: Family Medicine

## 2014-09-14 DIAGNOSIS — M533 Sacrococcygeal disorders, not elsewhere classified: Secondary | ICD-10-CM

## 2014-09-14 DIAGNOSIS — M5416 Radiculopathy, lumbar region: Secondary | ICD-10-CM

## 2014-09-15 ENCOUNTER — Telehealth: Payer: Self-pay | Admitting: *Deleted

## 2014-09-15 DIAGNOSIS — M999 Biomechanical lesion, unspecified: Secondary | ICD-10-CM

## 2014-09-15 NOTE — Telephone Encounter (Signed)
-----   Message from Judi SaaZachary M Smith, DO sent at 09/15/2014 10:47 AM EDT ----- Call patient and tell her no change which is good and if continuing to have pain can do epidural at L3-4 and if she would like please order

## 2014-09-21 ENCOUNTER — Ambulatory Visit
Admission: RE | Admit: 2014-09-21 | Discharge: 2014-09-21 | Disposition: A | Discharge status: Home / Self Care | Payer: Medicare Other | Source: Ambulatory Visit | Attending: Family Medicine | Admitting: Family Medicine

## 2014-09-21 DIAGNOSIS — M999 Biomechanical lesion, unspecified: Secondary | ICD-10-CM

## 2014-09-21 MED ORDER — METHYLPREDNISOLONE ACETATE 40 MG/ML INJ SUSP (RADIOLOG
120.0000 mg | Freq: Once | INTRAMUSCULAR | Status: AC
  Administered 2014-09-21: 120 mg via EPIDURAL

## 2014-09-21 MED ORDER — IOHEXOL 180 MG/ML  SOLN
1.0000 mL | Freq: Once | INTRAMUSCULAR | Status: AC | PRN
  Administered 2014-09-21: 1 mL via EPIDURAL

## 2014-09-21 NOTE — Discharge Instructions (Signed)

## 2014-09-23 ENCOUNTER — Other Ambulatory Visit: Payer: Self-pay | Admitting: Family Medicine

## 2014-09-25 ENCOUNTER — Other Ambulatory Visit: Payer: Self-pay | Admitting: Endocrinology

## 2014-09-25 DIAGNOSIS — Z1231 Encounter for screening mammogram for malignant neoplasm of breast: Secondary | ICD-10-CM

## 2014-09-25 NOTE — Telephone Encounter (Signed)
Refill done.  

## 2014-11-01 ENCOUNTER — Ambulatory Visit: Payer: Medicare Other

## 2014-11-07 ENCOUNTER — Ambulatory Visit (INDEPENDENT_AMBULATORY_CARE_PROVIDER_SITE_OTHER): Payer: Medicare Other

## 2014-11-07 DIAGNOSIS — Z1231 Encounter for screening mammogram for malignant neoplasm of breast: Secondary | ICD-10-CM | POA: Diagnosis not present

## 2014-11-07 IMAGING — MG MM DIGITAL SCREENING
7 series · 7 of 7 positions shown · non-contrast
Comparison: Previous exam(s).

CLINICAL DATA: Screening.

EXAM:
DIGITAL SCREENING BILATERAL MAMMOGRAM WITH CAD

[R CC]
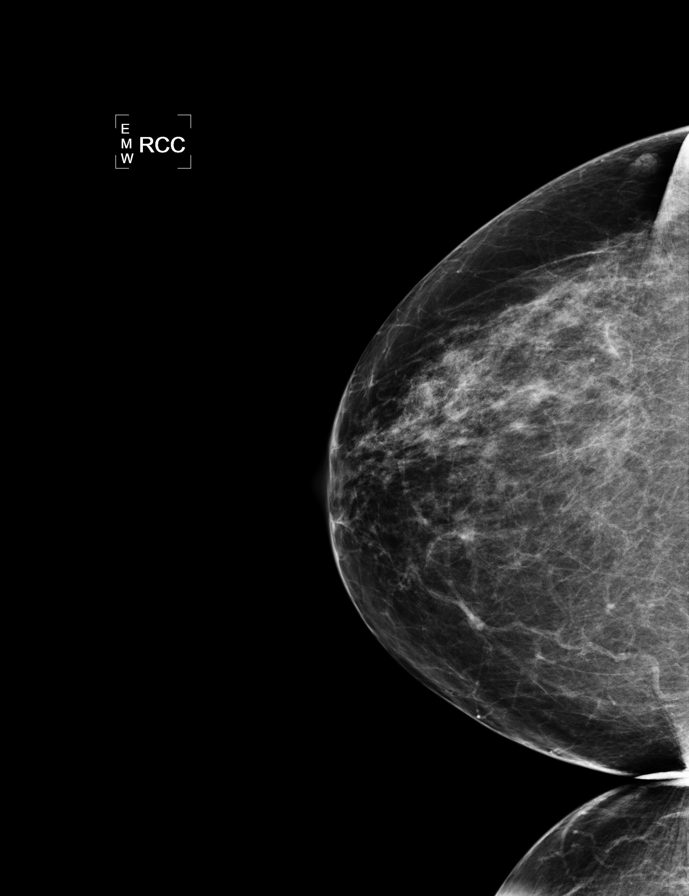

[L CC]
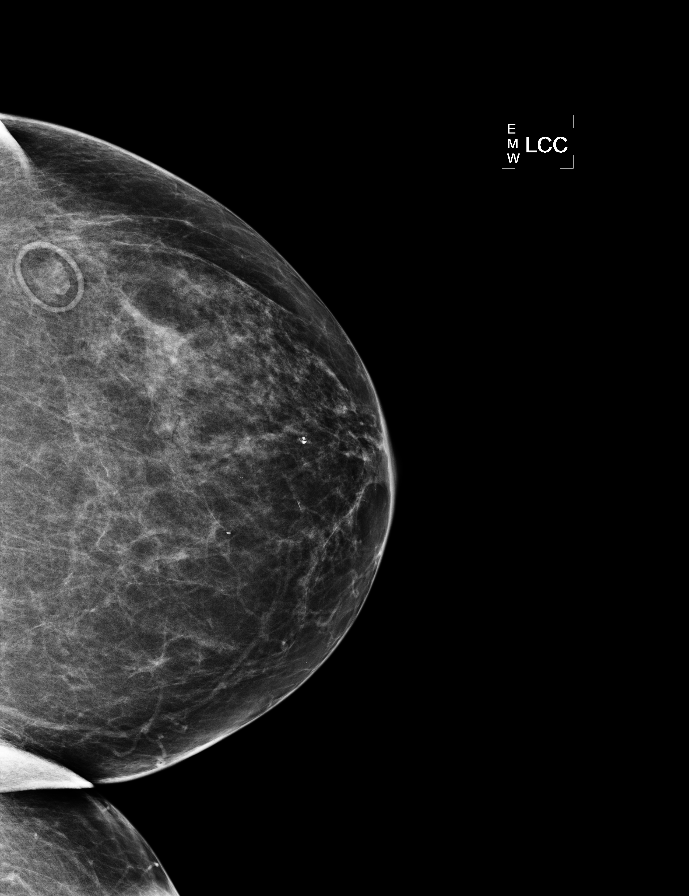

[L MLO]
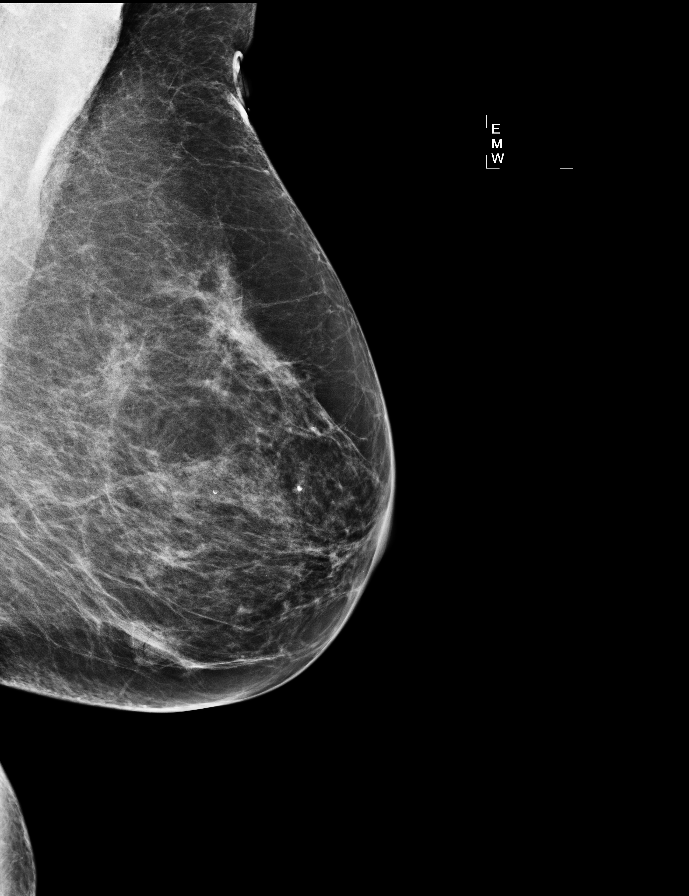

[R MLO (1 of 2)]
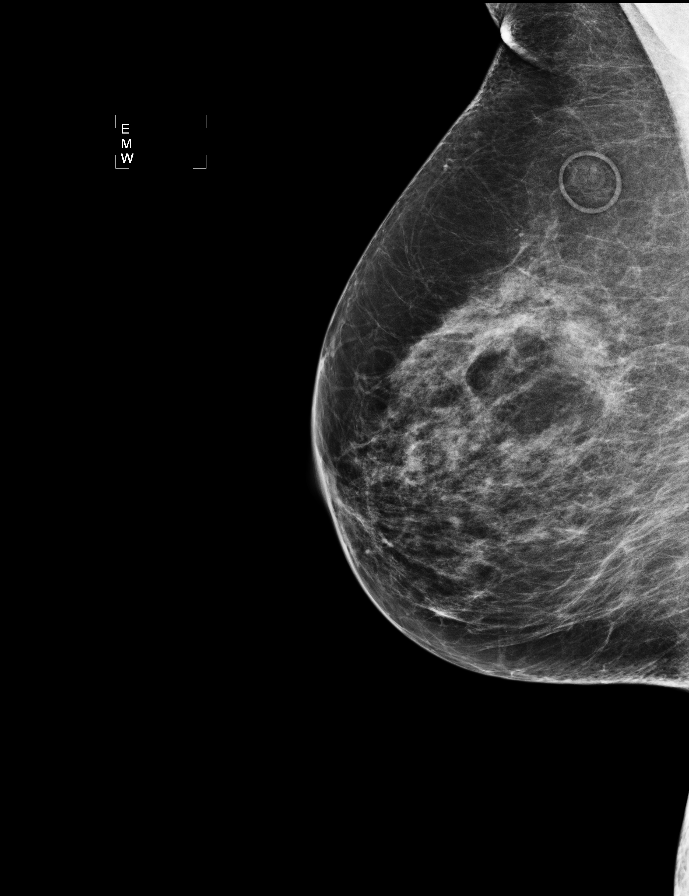

[R XCCL]
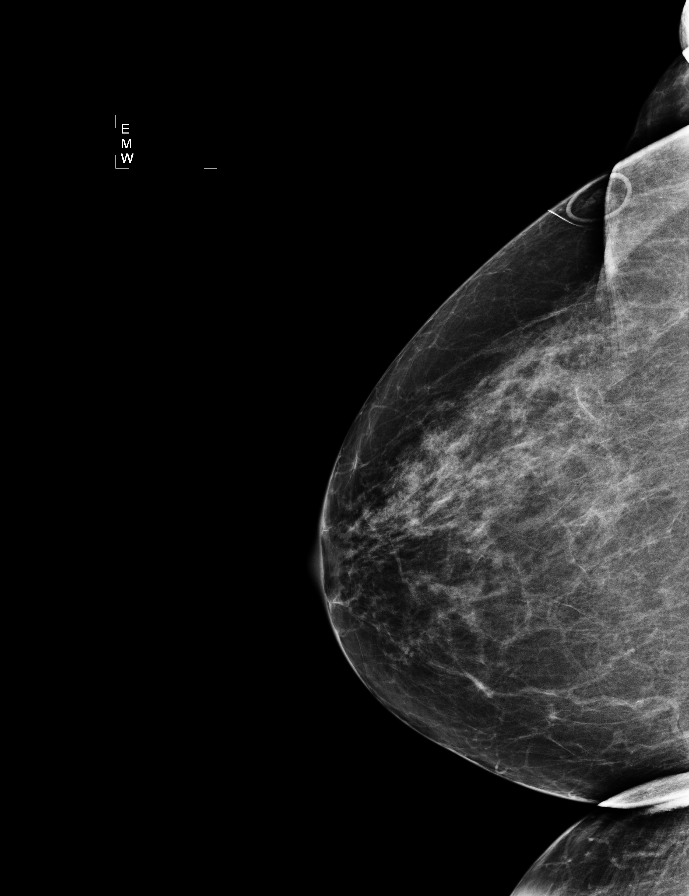

[L XCCL]
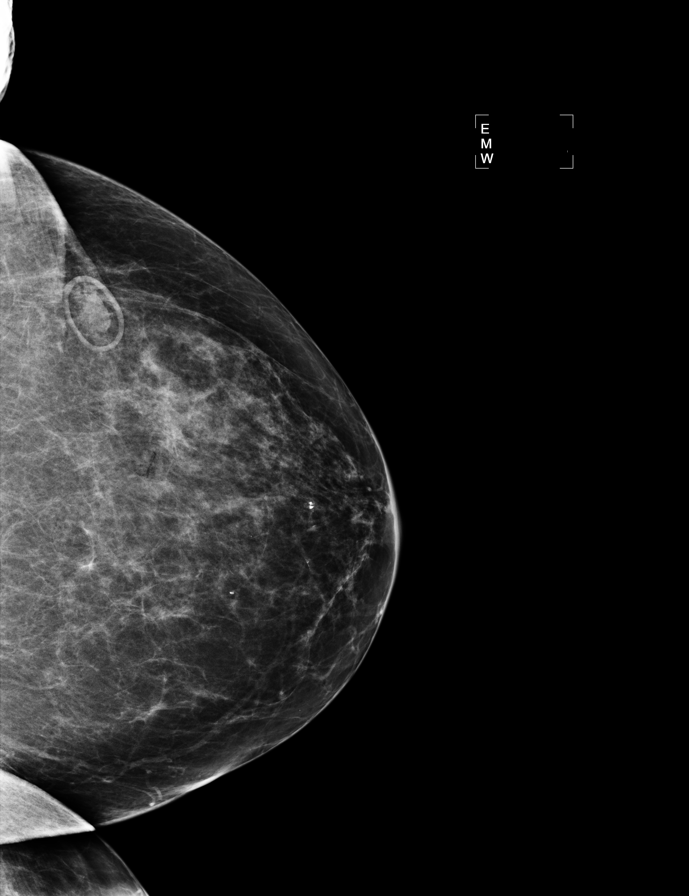

[R MLO (2 of 2)]
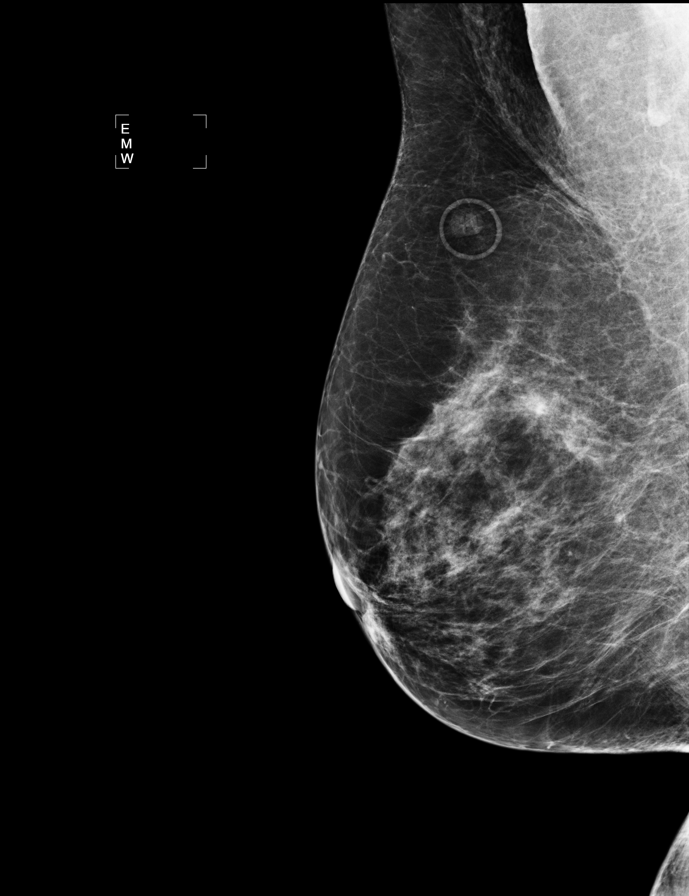

[7 of 7 positions shown; findings below may reference images not displayed]

ACR Breast Density Category b: There are scattered areas of
fibroglandular density.
FINDINGS: There are no findings suspicious for malignancy. Images were
processed with CAD.
IMPRESSION: No mammographic evidence of malignancy. A result letter of this
screening mammogram will be mailed directly to the patient.

RECOMMENDATION:
Screening mammogram in one year. (Code:[US])

BI-RADS CATEGORY  1: Negative.

## 2014-11-17 ENCOUNTER — Encounter: Payer: Self-pay | Admitting: Family Medicine

## 2014-11-17 ENCOUNTER — Ambulatory Visit (INDEPENDENT_AMBULATORY_CARE_PROVIDER_SITE_OTHER): Payer: Medicare Other | Admitting: Family Medicine

## 2014-11-17 VITALS — BP 132/72 | HR 97 | Ht 64.0 in | Wt 121.0 lb

## 2014-11-17 DIAGNOSIS — M48062 Spinal stenosis, lumbar region with neurogenic claudication: Secondary | ICD-10-CM

## 2014-11-17 DIAGNOSIS — M4806 Spinal stenosis, lumbar region: Secondary | ICD-10-CM | POA: Diagnosis not present

## 2014-11-17 NOTE — Progress Notes (Signed)
Pre visit review using our clinic review tool, if applicable. No additional management support is needed unless otherwise documented below in the visit note. 

## 2014-11-17 NOTE — Progress Notes (Signed)
  [facility] [address] [place], [place] [address] Phone: [phone] Subjective:    CC: back pain si joint dysfunction. Followup   BJY:NWGNFAOZHY [name] is a 81 y.o. female coming in with complaint of back pain. Patient has known history of degenerative joint disease and degenerative disc disease of the lumbar spine. 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 . Patient states she is actually taking 100 mg in morning and afternoon and 300 mg a gabapentin at night. Patient had more recent MRI showing the patient did have worsening spinal stenosis at L4-L5. 2 months ago patient was given an epidural at this level. Patient was doing much better until the last 2 weeks. States that the pain is starting come back. Is also having more fatigued. Patient has noticed that her legs are starting to have the weakness that she had prior to the injection. Patient continues all medications as well as right to do the exercises classes but is having difficulty.  Past medical history, social, surgical and family history all reviewed in electronic medical record.   Review of Systems: No headache, visual changes, nausea, vomiting, diarrhea, constipation, dizziness, abdominal pain, skin rash, fevers, chills, night sweats, weight loss, swollen lymph nodes, body aches, joint swelling, muscle aches, chest pain, shortness of breath, mood changes.   Objective Blood pressure 132/72, pulse 97, height  (1.626 m), weight 121 lb (54.885 kg), SpO2 92 %.  General: No apparent distress alert and oriented x3 mood and affect normal, dressed appropriately. Somewhat frail HEENT: Pupils equal, extraocular movements intact  Respiratory: Patient's speak in full sentences and does not appear short of breath  Cardiovascular: No lower extremity edema, non tender, no erythema  Skin: Warm dry intact with no signs of infection or rash on extremities or on axial skeleton.  Abdomen: Soft  nontender  Neuro: Cranial nerves II through XII are intact, neurovascularly intact in all extremities with 2+ DTRs and 2+ pulses.  Lymph: No lymphadenopathy of posterior or anterior cervical chain or axillae bilaterally.  Gait more antalgic gait and usual MSK:  Non tender with full range of motion and good stability and symmetric strength and tone of shoulders, elbows, wrist, hip, knee and ankles bilaterally.  Back Exam:  Inspection: Mild increase in kyphosis but stable from previous exam Motion: Flexion 350 deg, Extension 25 deg, Side Bending to 35 deg bilaterally,  Rotation to 25 deg bilaterally  SLR laying: Positive left side XSLR laying: Negative  Palpable tenderness:  More of a diffuse tenderness over the past spinal musculature of the lumbar spine FABER: Difficult to assess because patient is even more painful than usual. Sensory change: Gross sensation intact to all lumbar and sacral dermatomes.  Reflexes: 2+ at both patellar tendons, 2+ at achilles tendons, Babinski's downgoing.  Strength at foot  Plantar-flexion: 5/5 Dorsi-flexion: 5/5 Eversion: 5/5 Inversion: 5/5  Leg strength  Quad: 5/5 Hamstring: 4/5 Hip flexor: 3/5 Hip abductors: 3/5 on left side compared to full strength on right side. Increasing weakness from previous exam.     Impression and Recommendations:     This case required medical decision making of moderate complexity.

## 2014-11-17 NOTE — Assessment & Plan Note (Signed)
Discussed with patient in great length again. I do think that spinal stenosis as well as contributing to most of the weakness. We discussed iron supplementation as well as continuing vitamin D supplementation. We discussed that I think another epidural would be beneficial with her having good results previously. Patient will have this done in and see me again 2 weeks later. We discussed that if patient has improvement but continues to get worse or continues with weakness we may need to consider referral to neurosurgery.  Spent  25 minutes with patient face-to-face and had greater than 50% of counseling including as described above in assessment and plan.

## 2014-11-17 NOTE — Patient Instructions (Signed)
Good to see you Ice is your friend Epidural would likely help Iron  daily or 3 times a week if cause constipation  Flexeril 5 mg up to 3 times a day as needed Continue the gabapentin  If still tired we will need to run some labs after 2 weeks on the iron.  Call me 1-2 week after the epidural and tell me how you are doing.

## 2014-11-29 ENCOUNTER — Other Ambulatory Visit: Payer: Self-pay | Admitting: Family Medicine

## 2014-11-29 ENCOUNTER — Ambulatory Visit
Admission: RE | Admit: 2014-11-29 | Discharge: 2014-11-29 | Disposition: A | Discharge status: Home / Self Care | Payer: Medicare Other | Source: Ambulatory Visit | Attending: Family Medicine | Admitting: Family Medicine

## 2014-11-29 VITALS — BP 150/85 | HR 94

## 2014-11-29 DIAGNOSIS — M999 Biomechanical lesion, unspecified: Secondary | ICD-10-CM

## 2014-11-29 DIAGNOSIS — M48062 Spinal stenosis, lumbar region with neurogenic claudication: Secondary | ICD-10-CM

## 2014-11-29 MED ORDER — METHYLPREDNISOLONE ACETATE 40 MG/ML INJ SUSP (RADIOLOG: 120.0000 mg | Freq: Once | INTRAMUSCULAR | Status: DC

## 2014-11-29 MED ORDER — IOHEXOL 180 MG/ML  SOLN: 1.0000 mL | Freq: Once | INTRAMUSCULAR | Status: DC | PRN

## 2015-01-05 ENCOUNTER — Other Ambulatory Visit: Payer: Self-pay | Admitting: Family Medicine

## 2015-01-09 ENCOUNTER — Encounter (INDEPENDENT_AMBULATORY_CARE_PROVIDER_SITE_OTHER): Payer: Medicare Other

## 2015-01-09 ENCOUNTER — Other Ambulatory Visit: Payer: Self-pay | Admitting: Endocrinology

## 2015-01-09 DIAGNOSIS — I493 Ventricular premature depolarization: Secondary | ICD-10-CM

## 2015-01-09 DIAGNOSIS — I491 Atrial premature depolarization: Secondary | ICD-10-CM

## 2015-01-09 DIAGNOSIS — W19XXXA Unspecified fall, initial encounter: Secondary | ICD-10-CM

## 2015-01-09 DIAGNOSIS — Y92009 Unspecified place in unspecified non-institutional (private) residence as the place of occurrence of the external cause: Secondary | ICD-10-CM

## 2015-01-09 DIAGNOSIS — R079 Chest pain, unspecified: Secondary | ICD-10-CM

## 2015-01-09 DIAGNOSIS — Y92099 Unspecified place in other non-institutional residence as the place of occurrence of the external cause: Secondary | ICD-10-CM

## 2015-01-15 ENCOUNTER — Ambulatory Visit (INDEPENDENT_AMBULATORY_CARE_PROVIDER_SITE_OTHER): Payer: Medicare Other | Admitting: Family Medicine

## 2015-01-15 ENCOUNTER — Encounter: Payer: Self-pay | Admitting: Family Medicine

## 2015-01-15 VITALS — BP 124/82 | HR 86 | Ht 64.0 in | Wt 118.0 lb

## 2015-01-15 DIAGNOSIS — R29818 Other symptoms and signs involving the nervous system: Secondary | ICD-10-CM

## 2015-01-15 DIAGNOSIS — R42 Dizziness and giddiness: Secondary | ICD-10-CM | POA: Diagnosis not present

## 2015-01-15 DIAGNOSIS — M4806 Spinal stenosis, lumbar region: Secondary | ICD-10-CM

## 2015-01-15 DIAGNOSIS — R2689 Other abnormalities of gait and mobility: Secondary | ICD-10-CM

## 2015-01-15 DIAGNOSIS — S060X0A Concussion without loss of consciousness, initial encounter: Secondary | ICD-10-CM

## 2015-01-15 DIAGNOSIS — M48062 Spinal stenosis, lumbar region with neurogenic claudication: Secondary | ICD-10-CM

## 2015-01-15 DIAGNOSIS — R279 Unspecified lack of coordination: Secondary | ICD-10-CM | POA: Diagnosis not present

## 2015-01-15 MED ORDER — FLUTICASONE PROPIONATE 50 MCG/ACT NA SUSP: 2.0000 | Freq: Every day | NASAL | Status: DC

## 2015-01-15 NOTE — Assessment & Plan Note (Signed)
Patient has had problems with dizziness and giddiness before. I do think the patient is likely having more of a seasonal allergy again. Patient did have a refill of her nose spray. Patient though fall did cause a concussion. We will do more of a concussion protocol and patient come back in 2 weeks for further evaluation. Patient as well as daughter even signs and symptoms and when to seek medical attention. Patient did have a workup previously for her heart that was unremarkable for any arrhythmias. No signs for a subdural hematoma at this time.

## 2015-01-15 NOTE — Progress Notes (Signed)
  [facility] [address] [place], [place] [address] Phone: [phone](336) [phone] Subjective:    CC: back pain spinal stenosis. Recent falls  FAO:ZHYQMVHQIOHPI:Subjective [name] is a 81 y.o. female coming in with complaint of back pain.patient has had some recent falls. Patient did have this of approximately 1 year ago. When reviewing the chart patient seemed to have more of a seasonal allergy causing in her ear effusion. Patient is no longer using the. Patient has had 3 falls patella one was yesterday. Patient fell and hit the back of her head on concrete. Patient denies any bleeding. Has had some difficulty with concentration. No weakness or numbness in extremities. Patient fall was witnessed by daughter who is here as well. Patient has had some mild trouble with word finding. No visual disturbances, no abdominal pain. Patient is feels like she is having trouble focusing.  Past medical history, social, surgical and family history all reviewed in electronic medical record.   Review of Systems: No headache, visual changes, nausea, vomiting, diarrhea, constipation, dizziness, abdominal pain, skin rash, fevers, chills, night sweats, weight loss, swollen lymph nodes, body aches, joint swelling, muscle aches, chest pain, shortness of breath, mood changes.   Objective Blood pressure 124/82, pulse 86, height 5\' 4"  (1.626 m), weight 118 lb (53.524 kg), SpO2 92 %.  General: No apparent distress alert and oriented x3 mood and affect normal, dressed appropriately. Somewhat frail HEENT: Pupils equal, extraocular movements intact patient though does have nystagmus with concussion testing within neuro vestibular. Patient also has pain bruise on the parietal right side of her hand. No significant defect in the bone. No break down in the skin needed. Respiratory: Patient's speak in full sentences and does not appear short of breath  Cardiovascular: No lower extremity edema, non tender, no  erythema  Skin: Warm dry intact with no signs of infection or rash on extremities or on axial skeleton.  Abdomen: Soft nontender  Neuro: Cranial nerves II through XII are intact, neurovascularly intact in all extremities with 2+ DTRs and 2+ pulses.  Lymph: No lymphadenopathy of posterior or anterior cervical chain or axillae bilaterally.  Gait cautious but normal MSK:  Non tender with full range of motion and good stability and symmetric strength and tone of shoulders, elbows, wrist, hip, knee and ankles bilaterally.  Back Exam:  Inspection: Mild increase in kyphosis b Motion: Flexion 35 deg, Extension 25 deg, Side Bending to 35 deg bilaterally,  Rotation to 25 deg bilaterally  SLR laying: negative XSLR laying: Negative  Palpable tenderness:  Diffuse tenderness of the paraspinal musculature of the lumbar spine and the sacroiliac joints. FABER: Difficult to assess because patient is even more painful than usual. Sensory change: Gross sensation intact to all lumbar and sacral dermatomes.  Reflexes: 2+ at both patellar tendons, 2+ at achilles tendons, Babinski's downgoing.  Strength at foot  Plantar-flexion: 5/5 Dorsi-flexion: 5/5 Eversion: 5/5 Inversion: 5/5  Leg strength  Quad: 5/5 Hamstring: 4/5 Hip flexor: 3/5 Hip abductors: 4/5and symmetric     Impression and Recommendations:     This case required medical decision making of moderate complexity.

## 2015-01-15 NOTE — Progress Notes (Signed)
Pre visit review using our clinic review tool, if applicable. No additional management support is needed unless otherwise documented below in the visit note. 

## 2015-01-15 NOTE — Assessment & Plan Note (Signed)
Patient is showing no signs of weakness at this time. No neurologic compromise. I think patient's deep tendon reflexes are intact. Doing well overall. We'll continue to monitor. I do believe that she is having symptoms with balance and coordination. Patient will be referred to physical therapy.

## 2015-01-15 NOTE — Assessment & Plan Note (Signed)
Patient likely does have concussion. No loss of consciousness at this time. We discussed icing regimen. We discussed different over-the-counter natural supplements that be safe with her comorbidities. Patient knows that if any worsening symptoms or another fall to seek medical attention immediately. No signs on neurologic exam today for any neurovascular compromise. Patient still has some nystagmus. Patient also had difficulty with serial sevens as well as recall.

## 2015-01-15 NOTE — Patient Instructions (Addendum)
  Good to see you I do think you have a concussion.   To help improve COGNITIVE function: Using fish oil/omega 3 that is 1000 mg (or roughly 600 mg EPA/DHA), starting as soon as possible after concussion, take: 2 tabs TWICE DAILY  for the next 3 days, then 2 tabs ONCE DAILY  for the next 10 days   To help reduce HEADACHES: Coenzyme Q10 160mg  ONCE DAILY Riboflavin/Vitamin B2 400mg  ONCE DAILY Magnesium oxide 400mg  ONCE - TWICE DAILY May stop after headaches are resolved.                                                                                             We will get you into physical therapy Dan HumphreysWalker would be a good idea Start the nose spray 1 spray each nostril 2 times daily Call me if anything changes   I want to see you again in 2 weeks.

## 2015-01-30 ENCOUNTER — Ambulatory Visit (INDEPENDENT_AMBULATORY_CARE_PROVIDER_SITE_OTHER): Payer: Medicare Other | Admitting: Family Medicine

## 2015-01-30 ENCOUNTER — Ambulatory Visit: Payer: Medicare Other | Admitting: Family Medicine

## 2015-01-30 ENCOUNTER — Encounter: Payer: Self-pay | Admitting: Family Medicine

## 2015-01-30 VITALS — BP 118/72 | HR 81 | Ht 64.0 in | Wt 119.0 lb

## 2015-01-30 DIAGNOSIS — S060X0D Concussion without loss of consciousness, subsequent encounter: Secondary | ICD-10-CM | POA: Diagnosis not present

## 2015-01-30 DIAGNOSIS — M48062 Spinal stenosis, lumbar region with neurogenic claudication: Secondary | ICD-10-CM

## 2015-01-30 DIAGNOSIS — M4806 Spinal stenosis, lumbar region: Secondary | ICD-10-CM

## 2015-01-30 MED ORDER — GABAPENTIN 100 MG PO CAPS: ORAL_CAPSULE | ORAL | Status: DC

## 2015-01-30 NOTE — Progress Notes (Signed)
Pre visit review using our clinic review tool, if applicable. No additional management support is needed unless otherwise documented below in the visit note. 

## 2015-01-30 NOTE — Progress Notes (Signed)
  [facility] Monserrate Blaschke D.O. [facility] [address] [place], [place] [address] Phone: [phone]([phone] Subjective:    CC: back pain spinal stenosis. Recent falls, follow-up concussion  BJY:NWGNFAOZHYHPI:Subjective Donna B Etheleen MayhewFuquay is a 81 y.o. female coming in with complaint of back pain.patient has had some recent falls.   Patient did have a fall in last follow-up that appeared the patient did have a concussion. Patient was to do some conservative therapy, we discussed vestibular neuro training and patient was sent to formal physical therapy for balance and coordination. Patient has had some vertigo in the past and did have a significant workup. He was unremarkable. [facility]. Patient did start this again in 2 weeks ago.patient has noticed some mild improvement. Continues to have some 15. No longer having trouble with memory though. Feels like herself with the mind. Continues to have difficulty though with the balance and coordination and has not started the formal physical therapy yet. Continues to have some back pain. Nothing that is significant though. Still pain all the time with some mild radiculopathy down the left leg. Past medical history, social, surgical and family history all reviewed in electronic medical record.   Review of Systems: No headache, visual changes, nausea, vomiting, diarrhea, constipation, dizziness, abdominal pain, skin rash, fevers, chills, night sweats, weight loss, swollen lymph nodes, body aches, joint swelling, muscle aches, chest pain, shortness of breath, mood changes.   Objective Blood pressure 118/72, pulse 81, height 5\' 4"  (1.626 m), weight 119 lb (53.978 kg), SpO2 95 %.  General: No apparent distress alert and oriented x3 mood and affect normal, dressed appropriately. Somewhat frail HEENT: Pupils equal, extraocular movements intactNo significant defect in the bone. No break down in the skin needed. Respiratory: Patient's  speak in full sentences and does not appear short of breath  Cardiovascular: No lower extremity edema, non tender, no erythema  Skin: Warm dry intact with no signs of infection or rash on extremities or on axial skeleton.  Abdomen: Soft nontender  Neuro: Cranial nerves II through XII are intact, neurovascularly intact in all extremities with 2+ DTRs and 2+ pulses.  Lymph: No lymphadenopathy of posterior or anterior cervical chain or axillae bilaterally.  Gait cautious but normal MSK:  Non tender with full range of motion and good stability and symmetric strength and tone of shoulders, elbows, wrist, hip, knee and ankles bilaterally.  Back Exam:  Inspection: Mild increase in kyphosis  Motion: Flexion 35 deg, Extension 25 deg, Side Bending to 35 deg bilaterally,  Rotation to 25 deg bilaterally no change in range of motion SLR laying: negative XSLR laying: Negative  Palpable tenderness:  Attending tenderness of the lumbar spine muscular. FABER:able to do Pearlean BrownieFaber which is an improvement from previous exam Sensory change: Gross sensation intact to all lumbar and sacral dermatomes.  Reflexes: 2+ at both patellar tendons, 2+ at achilles tendons, Babinski's downgoing.  Strength at foot  Plantar-flexion: 5/5 Dorsi-flexion: 5/5 Eversion: 5/5 Inversion: 5/5  Leg strength  Quad: 5/5 Hamstring: 4/5 Hip flexor: 3/5 Hip abductors: 4/5and symmetric     Impression and Recommendations:     This case required medical decision making of moderate complexity.

## 2015-01-30 NOTE — Patient Instructions (Addendum)
Good to see you Lets continue with the physical therapy or the balance and coordination Continue the nose spray  Keep the brace loose on the calf Refilled the gabapentin  Discontinued the flexeril Try to limit the meclizine at night.  Tell the grandson congrats! Watch the swelling of the ankle and call me if it worsens See me again in 6 weeks to make sure you are doing better Happy holidays!

## 2015-01-30 NOTE — Assessment & Plan Note (Signed)
Patient seems increased amount this time. We may need to consider another epidural patient continues to have difficulty. We discussed icing regimen. Patient does not respond well to osteopathic manipulation in the past. Otherwise surgical intervention may be necessary.

## 2015-01-30 NOTE — Assessment & Plan Note (Signed)
Seems resolved at this time. Patient will continue with the gabapentin. No longer taking the muscle relaxers. We discussed the importance of the formal physical therapy which patient will follow-up with. Patient and will come back and see me again in 4-6 weeks for further evaluation and treatment.

## 2015-02-09 ENCOUNTER — Other Ambulatory Visit: Payer: Self-pay | Admitting: *Deleted

## 2015-02-09 DIAGNOSIS — M48062 Spinal stenosis, lumbar region with neurogenic claudication: Secondary | ICD-10-CM

## 2015-02-12 ENCOUNTER — Other Ambulatory Visit: Payer: Self-pay | Admitting: *Deleted

## 2015-02-12 DIAGNOSIS — M25511 Pain in right shoulder: Secondary | ICD-10-CM

## 2015-02-13 ENCOUNTER — Ambulatory Visit (INDEPENDENT_AMBULATORY_CARE_PROVIDER_SITE_OTHER)
Admission: RE | Admit: 2015-02-13 | Discharge: 2015-02-13 | Disposition: A | Discharge status: Home / Self Care | Payer: Medicare Other | Source: Ambulatory Visit | Attending: Family Medicine | Admitting: Family Medicine

## 2015-02-13 DIAGNOSIS — M25511 Pain in right shoulder: Secondary | ICD-10-CM | POA: Diagnosis not present

## 2015-02-21 ENCOUNTER — Ambulatory Visit
Admission: RE | Admit: 2015-02-21 | Discharge: 2015-02-21 | Disposition: A | Discharge status: Home / Self Care | Payer: Medicare Other | Source: Ambulatory Visit | Attending: Family Medicine | Admitting: Family Medicine

## 2015-02-21 DIAGNOSIS — M48062 Spinal stenosis, lumbar region with neurogenic claudication: Secondary | ICD-10-CM

## 2015-02-21 MED ORDER — METHYLPREDNISOLONE ACETATE 40 MG/ML INJ SUSP (RADIOLOG
120.0000 mg | Freq: Once | INTRAMUSCULAR | Status: AC
  Administered 2015-02-21: 120 mg via INTRA_ARTICULAR

## 2015-02-21 MED ORDER — METHYLPREDNISOLONE ACETATE 40 MG/ML INJ SUSP (RADIOLOG: 120.0000 mg | Freq: Once | INTRAMUSCULAR | Status: DC

## 2015-02-21 MED ORDER — IOHEXOL 180 MG/ML  SOLN: 1.0000 mL | Freq: Once | INTRAMUSCULAR | Status: DC | PRN

## 2015-02-21 MED ORDER — IOHEXOL 180 MG/ML  SOLN
1.0000 mL | Freq: Once | INTRAMUSCULAR | Status: AC | PRN
  Administered 2015-02-21: 1 mL via INTRAVENOUS

## 2015-02-21 NOTE — Discharge Instructions (Signed)

## 2015-03-13 ENCOUNTER — Ambulatory Visit (INDEPENDENT_AMBULATORY_CARE_PROVIDER_SITE_OTHER): Payer: Medicare Other | Admitting: Family Medicine

## 2015-03-13 ENCOUNTER — Encounter: Payer: Self-pay | Admitting: Family Medicine

## 2015-03-13 VITALS — BP 126/80 | HR 87 | Ht 64.0 in | Wt 117.0 lb

## 2015-03-13 DIAGNOSIS — M7551 Bursitis of right shoulder: Secondary | ICD-10-CM

## 2015-03-13 DIAGNOSIS — M4806 Spinal stenosis, lumbar region: Secondary | ICD-10-CM | POA: Diagnosis not present

## 2015-03-13 DIAGNOSIS — M48062 Spinal stenosis, lumbar region with neurogenic claudication: Secondary | ICD-10-CM

## 2015-03-13 NOTE — Assessment & Plan Note (Signed)
Given an injection today and tolerated the procedure very well. We discussed icing regimen and home exercises. Patient has remained active but not as active as she usually is. We discussed what activities potentially avoid. Patient has been doing home exercises on areolar basis for many other things and given some mild range of motion exercises but will avoid any significant strengthening exercises at this point. No weakness of the rotator cuff. Patient will come back and see me again in 6 weeks if pain is not completely resolved. Patient did have complete resolution after the injection.

## 2015-03-13 NOTE — Progress Notes (Signed)
Pre visit review using our clinic review tool, if applicable. No additional management support is needed unless otherwise documented below in the visit note. 

## 2015-03-13 NOTE — Progress Notes (Signed)
Tawana ScaleZach Smith D.O. [facility] [address] [place], [place] [address] Phone: [phone] Subjective:    CC: back pain spinal stenosis. Recent falls, follow-up concussion  BJY:NWGNFAOZHYHPI:Subjective [name] is a 82 y.o. female coming in with complaint of back pain.patient has had some recent falls.    Continues to have some back pain.seems to be worsening. Patient was given another epidural steroid injection for underlying spinal stenosis. No significant improvement. Not sexually if anything seems to be worsening.Patient states that it is starting to affect her daily activities more. He is going to formal physical therapy which has helped with the balance and coordination but not the pain in the back. Pain in the back is also waking her up at night. Continues the gabapentin with no significant improvement. Has had difficulty though with concentration when on higher doses so we continue with the lower dose.  Patient is also having right shoulder pain. Describes it as a dull throbbing aching sensation. Has been pulling up more with her arm. Denies any radiation down the arm. She denies significant neck pain with it. Rates the severity of pain a 6 out of 10. Can wake her up at night. No weakness. No numbness.  Review of Systems: No headache, visual changes, nausea, vomiting, diarrhea, constipation, dizziness, abdominal pain, skin rash, fevers, chills, night sweats, weight loss, swollen lymph nodes, body aches, joint swelling, muscle aches, chest pain, shortness of breath, mood changes.   Objective Blood pressure 126/80, pulse 87, height 5\' 4"  (1.626 m), weight 117 lb (53.071 kg), SpO2 96 %.  General: No apparent distress alert and oriented x3 mood and affect normal, dressed appropriately. Somewhat frail HEENT: Pupils equal, extraocular movements intactNo significant defect in the bone. No break down in the skin needed. Respiratory: Patient's speak in full sentences and does not appear  short of breath  Cardiovascular: No lower extremity edema, non tender, no erythema  Skin: Warm dry intact with no signs of infection or rash on extremities or on axial skeleton.  Abdomen: Soft nontender  Neuro: Cranial nerves II through XII are intact, neurovascularly intact in all extremities with 2+ DTRs and 2+ pulses.  Lymph: No lymphadenopathy of posterior or anterior cervical chain or axillae bilaterally.  Gait cautious but normal MSK:  Non tender with full range of motion and good stability and symmetric strength and tone of shoulders, elbows, wrist, hip, knee and ankles bilaterally.  Back Exam:  Inspection: Mild increase in kyphosis  Motion: Flexion 35 deg, Extension 25 deg, Side Bending to 35 deg bilaterally,  Rotation to 25 deg bilaterally no change in range of motion SLR laying: negative XSLR laying: Negative  Palpable tenderness:  Attending tenderness of the lumbar spine muscular. FABER:able to do Pearlean BrownieFaber which is an improvement from previous exam Sensory change: Gross sensation intact to all lumbar and sacral dermatomes.  Reflexes: 2+ at both patellar tendons, 2+ at achilles tendons, Babinski's downgoing.  Strength at foot  Plantar-flexion: 5/5 Dorsi-flexion: 5/5 Eversion: 5/5 Inversion: 5/5  Leg strength  Quad: 5/5 Hamstring: 4/5 Hip flexor: 3/5 Hip abductors: 4/5and symmetric  Shoulder: Right Inspection reveals no abnormalities, atrophy or asymmetry. Palpation is normal with no tenderness over AC joint or bicipital groove. ROM is full in all planes passively. Rotator cuff strength normal throughout. signs of impingement with positive Neer and Hawkin's tests, but negative empty can sign. Speeds and Yergason's tests normal. No labral pathology noted with negative Obrien's, negative clunk and good stability. Normal scapular function observed. No  painful arc and no drop arm sign. No apprehension sign    After informed written and verbal consent, patient was seated on exam  table. Right shoulder was prepped with alcohol swab and utilizing posterior approach, patient's right glenohumeral space was injected with 4:1  marcaine 0.5%: Kenalog 40mg /dL. Patient tolerated the procedure well without immediate complications.     Impression and Recommendations:     This case required medical decision making of moderate complexity.

## 2015-03-13 NOTE — Patient Instructions (Signed)
Good to see you Ice is your friend Continue the physical therapy for the balance We will get you in to talk to Dr. Ollen BowlHarkins for other possibilities other than surgery Injected shoulder to give you some relief You know where I am if you have questions Happy New Year!

## 2015-03-13 NOTE — Assessment & Plan Note (Signed)
Patient is no longer responding to the epidural steroid injections. An states that she did not have any relief. Patient was having complete relief previously. We discussed with patient about different treatment options. Patient is electing to have referral to pain management for the possibility of other treatment modalities. Patient was to avoid any surgical intervention if possible. Has made improvement with patient being in physical therapy for the weakness. Patient continues remain active. Continues to gabapentin fairly regularly. Discussed all the different options and answered all questions. Patient can come back on an as-needed basis.

## 2015-03-17 ENCOUNTER — Other Ambulatory Visit: Payer: Self-pay | Admitting: Family Medicine

## 2015-03-19 NOTE — Telephone Encounter (Signed)
Refill done.  

## 2015-04-11 ENCOUNTER — Other Ambulatory Visit: Payer: Self-pay | Admitting: Family Medicine

## 2015-04-11 NOTE — Telephone Encounter (Signed)
Refill done.  

## 2015-07-31 ENCOUNTER — Telehealth: Payer: Self-pay | Admitting: Neurology

## 2015-07-31 NOTE — Telephone Encounter (Signed)
Dr. Everlena CooperJaffe, you can disregard prev. msg. Patient is coming in tomorrow @ 1:30. You had a cancellation.

## 2015-07-31 NOTE — Telephone Encounter (Signed)
I spoke with patient she states that falls have increased. States that its not Vertigo. She would like to be seen asap as this is concerning for her. Can we work her in on your schedule for Thursday @ 12?

## 2015-07-31 NOTE — Telephone Encounter (Signed)
I can't answer for his Thursday but he has a work in appt on June 9 you can use

## 2015-07-31 NOTE — Telephone Encounter (Signed)
[name] February 06, 2032. She would like you to please call her. She was seen in 2016. She left a message saying that she has fallen 3 times in the past week.  Her number is [phone]. Thank you

## 2015-08-01 ENCOUNTER — Ambulatory Visit (INDEPENDENT_AMBULATORY_CARE_PROVIDER_SITE_OTHER): Payer: Medicare Other | Admitting: Neurology

## 2015-08-01 ENCOUNTER — Encounter: Payer: Self-pay | Admitting: Neurology

## 2015-08-01 VITALS — BP 134/86 | HR 90 | Ht 64.0 in | Wt 111.7 lb

## 2015-08-01 DIAGNOSIS — R202 Paresthesia of skin: Secondary | ICD-10-CM | POA: Diagnosis not present

## 2015-08-01 DIAGNOSIS — S060X0D Concussion without loss of consciousness, subsequent encounter: Secondary | ICD-10-CM | POA: Diagnosis not present

## 2015-08-01 DIAGNOSIS — R296 Repeated falls: Secondary | ICD-10-CM | POA: Diagnosis not present

## 2015-08-01 DIAGNOSIS — R200 Anesthesia of skin: Secondary | ICD-10-CM

## 2015-08-01 NOTE — Progress Notes (Signed)
Chart forwarded.  

## 2015-08-01 NOTE — Progress Notes (Signed)
Physical Therapy faxed to [facility].

## 2015-08-01 NOTE — Progress Notes (Signed)
NEUROLOGY FOLLOW UP OFFICE NOTE  Donna FrankelJuanita B Morris [id number]  HISTORY OF PRESENT ILLNESS: Donna HoppingJuanita Morris is an 82 year old right-handed woman with hypertension and hyperlipidemia whom I previously saw for dizziness, today presents for frequent falls.  She is accompanied by her daughter who also provides history.  UPDATE: She lives in assisted living.  She continues to have falls, but have progressively gotten worse.  However, they are not accompanied by dizziness or lightheadedness.  When she is on her feet, she feels very unsteady.  Any turn of her body causes her to tip backwards.  She has had frequent falls where she has hit the back of her head several times.  She does not think she passes out.  She feels "funny" in the head and has had some memory problems on concussion tests.  She denies difficulty feeling the ground.  She has not had episodes of facial droop, slurred speech or unilateral weakness.  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  HISTORY: She had a concussion in June 2015 after a fall.  She simply fell backwards.  CT of the head showed chronic small vessel ischemic changes.  CT of cervical spine showed no fracture.  Since the fall, she has had episodes or recurrent dizziness.  She describes the dizziness as a sense of movement but no true spinning.  There is no associated nausea.  It is triggered when she turns or otherwise moves her head.  It lasts for just a couple of seconds.  She tried meclizine which didn't work well.  She had carotid doppler performed in July, which revealed no hemodynamically significant stenosis in the bilateral ICA and vertebral arteries.  MRA of the head and neck performed in August showed no hemodynamically significant intracranial or extracranial arterial stenosis.  In January 2016, she was sitting and eating dinner when she suddenly developed blurred vision only in the right eye.  There  was no clouding or darkening of vision.  There was no associated headache, facial droop or focal numbness or weakness.  She saw the eye doctor who told her she had dry eyes and was prescribed steroid drops, which has helped.  Sometimes, it would still occur off and on but only briefly.  MRI of brain from 05/12/14 showed moderate chronic small vessel disease and cerebral atrophy, but nothing acute.  She also has a history of lumbar spinal stenosis with claudication.  She takes gabapentin for the left leg pain.  MRI of lumbar spine from 09/14/14 showed stable multilevel spondylosis and mild to moderate multifactorial spinal stenosis at L3-4 with left foraminal narrowing greatest at L5-S1.  PAST MEDICAL HISTORY: Past Medical History  Diagnosis Date  . Hyperlipidemia   . Hypertension   . Allergy   . Arthritis     MEDICATIONS: Current Outpatient Prescriptions on File Prior to Visit  Medication Sig Dispense Refill  . aspirin 81 MG tablet Take 81 mg by mouth daily.    . calcium acetate (PHOSLO) 667 MG capsule Take 667 mg by mouth 3 (three) times daily with meals.    . cetirizine (ZYRTEC) 10 MG tablet Take 10 mg by mouth daily.    . CRESTOR 20 MG tablet Take 20 mg by mouth daily.    . fish oil-omega-3 fatty acids 1000 MG capsule Take 2 g by mouth daily.    . fluticasone (FLONASE) 50 MCG/ACT nasal spray Place 2 sprays into both nostrils  daily. 16 g 6  . lisinopril-hydrochlorothiazide (PRINZIDE,ZESTORETIC) 10-12.5 MG per tablet Take 10-12.5 tablets by mouth daily.    . Multiple Vitamin (MULTIVITAMIN) capsule Take 1 capsule by mouth daily.     No current facility-administered medications on file prior to visit.    ALLERGIES: No Known Allergies  FAMILY HISTORY: Family History  Problem Relation Age of Onset  . Heart disease Mother   . Heart disease Father     SOCIAL HISTORY: Social History   Social History  . Marital Status: Married    Spouse Name: N/A  . Number of Children: N/A  .  Years of Education: N/A   Occupational History  . Not on file.   Social History Main Topics  . Smoking status: Never Smoker   . Smokeless tobacco: Never Used  . Alcohol Use: Yes     Comment: socially  . Drug Use: No  . Sexual Activity: No   Other Topics Concern  . Not on file   Social History Narrative    REVIEW OF SYSTEMS: Constitutional: No fevers, chills, or sweats, no generalized fatigue, change in appetite Eyes: No visual changes, double vision, eye pain Ear, nose and throat: No hearing loss, ear pain, nasal congestion, sore throat Cardiovascular: No chest pain, palpitations Respiratory:  No shortness of breath at rest or with exertion, wheezes GastrointestinaI: No nausea, vomiting, diarrhea, abdominal pain, fecal incontinence Genitourinary:  No dysuria, urinary retention or frequency Musculoskeletal:  No neck pain, back pain Integumentary: No rash, pruritus, skin lesions Neurological: as above Psychiatric: No depression, insomnia, anxiety Endocrine: No palpitations, fatigue, diaphoresis, mood swings, change in appetite, change in weight, increased thirst Hematologic/Lymphatic:  No purpura, petechiae. Allergic/Immunologic: no itchy/runny eyes, nasal congestion, recent allergic reactions, rashes  PHYSICAL EXAM: Filed Vitals:   08/01/15 1320  BP: 134/86  Pulse: 90   General: No acute distress.  Patient appears well-groomed.  normal body habitus. Head:  Normocephalic/atraumatic Eyes:  Fundi examined but not visualized Neck: supple, no paraspinal tenderness, full range of motion Heart:  Regular rate and rhythm Lungs:  Clear to auscultation bilaterally Back: No paraspinal tenderness Neurological Exam: alert and oriented to person, place, and time. Attention span and concentration intact, recent and remote memory intact, fund of knowledge intact.  Speech fluent and not dysarthric, language intact.  CN II-XII intact. Bulk and tone normal, muscle strength 5/5 throughout.   Sensation to pinprick and vibration reduced in toes and feet up to ankle bilaterally.  Deep tendon reflexes 2+ throughout except absent in left patellar, toes downgoing.  Hoffman's sign absent.  Finger to nose and heel to shin testing intact.  Cautious wide-based gait.  Difficulty turning around.  Cannot tandem walk.  Romberg with sway.  IMPRESSION: Frequent falls with concussions.  She does have evidence of peripheral neuropathy.  However, I would like to rule out intracranial abnormality and cervical myelopathy first.  PLAN: MRI of brain and cervical spine If unrevealing, will continue neuropathy workup Will get Home Health assessment  Must use walker at all times. Follow up in 3 months  27 minutes spent face to face with patient, over 50% spent discussing differential diagnoses and plan.  Shon Millet, DO  CC:  Darci Needle, MD

## 2015-08-01 NOTE — Patient Instructions (Signed)
1.  We will check MRI of brain and cervical spine 2.  If unrevealing, we will continue with workup for peripheral neuropathy, which is noted on your physical exam 3.  We will contact your assisted living to get a Home Health assessment. 4.  Please use walker at all times 5.  Follow up

## 2015-08-03 ENCOUNTER — Telehealth: Payer: Self-pay | Admitting: Neurology

## 2015-08-03 DIAGNOSIS — R296 Repeated falls: Secondary | ICD-10-CM

## 2015-08-03 NOTE — Telephone Encounter (Signed)
[facility] fax[facility] to [facility] (Ph: [phone](336) [phone] Fax: [phone])

## 2015-08-03 NOTE — Telephone Encounter (Signed)
[name] [birth year].  There was a call regarding her order. They need the order to say PT to Evaluate and Treat. The number is [phone]. Thank you

## 2015-08-09 ENCOUNTER — Telehealth: Payer: Self-pay

## 2015-08-09 ENCOUNTER — Ambulatory Visit
Admission: RE | Admit: 2015-08-09 | Discharge: 2015-08-09 | Disposition: A | Discharge status: Home / Self Care | Payer: Medicare Other | Source: Ambulatory Visit | Attending: Neurology | Admitting: Neurology

## 2015-08-09 DIAGNOSIS — R296 Repeated falls: Secondary | ICD-10-CM

## 2015-08-09 DIAGNOSIS — S060X0D Concussion without loss of consciousness, subsequent encounter: Secondary | ICD-10-CM

## 2015-08-09 NOTE — Telephone Encounter (Signed)
Pt has upcoming appointment w/ Dr. Lovell SheehanJenkins on 6/27 @ 2:15. Did forward notes to Dr. Lovell SheehanJenkins.

## 2015-08-09 NOTE — Telephone Encounter (Signed)
-----   Message from Drema DallasAdam R Jaffe, DO sent at 08/09/2015  2:56 PM EDT ----- Arthritis and disc bulges in the neck are pressing on the spinal cord.  This may cause problems with balance and falls.  I think this will require surgery.  I would like to refer her to neurosurgery for evaluation.  MRI of brain looks similar to study from 2016.

## 2015-08-28 ENCOUNTER — Other Ambulatory Visit: Payer: Self-pay | Admitting: Endocrinology

## 2015-08-28 DIAGNOSIS — Z1231 Encounter for screening mammogram for malignant neoplasm of breast: Secondary | ICD-10-CM

## 2015-10-06 ENCOUNTER — Other Ambulatory Visit: Payer: Self-pay | Admitting: Family Medicine

## 2015-10-08 NOTE — Telephone Encounter (Signed)
Refill done.  

## 2015-10-24 ENCOUNTER — Ambulatory Visit: Payer: Medicare Other | Admitting: Family Medicine

## 2015-11-09 ENCOUNTER — Other Ambulatory Visit: Payer: Self-pay | Admitting: Nurse Practitioner

## 2015-11-09 ENCOUNTER — Ambulatory Visit: Payer: Medicare Other | Admitting: Neurology

## 2015-11-09 ENCOUNTER — Ambulatory Visit (INDEPENDENT_AMBULATORY_CARE_PROVIDER_SITE_OTHER): Payer: Medicare Other

## 2015-11-09 DIAGNOSIS — Z1231 Encounter for screening mammogram for malignant neoplasm of breast: Secondary | ICD-10-CM

## 2015-11-12 ENCOUNTER — Encounter: Payer: Self-pay | Admitting: Neurology

## 2015-11-12 ENCOUNTER — Ambulatory Visit (INDEPENDENT_AMBULATORY_CARE_PROVIDER_SITE_OTHER): Payer: Medicare Other | Admitting: Neurology

## 2015-11-12 VITALS — BP 134/76 | HR 83 | Wt 108.0 lb

## 2015-11-12 DIAGNOSIS — M4712 Other spondylosis with myelopathy, cervical region: Secondary | ICD-10-CM | POA: Diagnosis not present

## 2015-11-12 DIAGNOSIS — H8109 Meniere's disease, unspecified ear: Secondary | ICD-10-CM | POA: Diagnosis not present

## 2015-11-12 DIAGNOSIS — R296 Repeated falls: Secondary | ICD-10-CM | POA: Diagnosis not present

## 2015-11-12 DIAGNOSIS — G609 Hereditary and idiopathic neuropathy, unspecified: Secondary | ICD-10-CM

## 2015-11-12 DIAGNOSIS — H8190 Unspecified disorder of vestibular function, unspecified ear: Secondary | ICD-10-CM

## 2015-11-12 NOTE — Progress Notes (Signed)
Chart forwarded.  

## 2015-11-12 NOTE — Patient Instructions (Signed)
Continue taking care to prevent falls Continue meclizine as monitored by your PA Follow up as needed.

## 2015-11-12 NOTE — Progress Notes (Signed)
NEUROLOGY FOLLOW UP OFFICE NOTE  [name] [id number]  HISTORY OF PRESENT ILLNESS: [name] is an 82 year old right-handed woman with hypertension and hyperlipidemia who for frequent falls.  She is accompanied by her daughter who also provides history.   UPDATE: To evaluate for intracranial abnormality or cervical myelopathy as cause for falls, MRI of brain and cervical spine performed on 08/09/15 was personally reviewed.  The brain showed chronic microvascular changes but the cervical spine showed multilevel spondylosis with foraminal narrowing and cord compression, worst at C5-6 and C6-7.  Her last fall was in June, in which she sustained a T12 compression fracture.  She was referred to neurosurgery for evaluation.   She saw Dr. Tressie Stalker.  Since her frequent falls may be multifactorial (vestibulopathy, peripheral neuropathy) and not clearly due to cervical myelopathy, a conservative approach with lumbosacral orthosis for the back pain was recommended.  She started taking meclizine twice daily and has not had any falls.  Once in a while, when standing up, she feels briefly off-balance, but then regains composure.  She denies dizziness.  HISTORY: She had a concussion in June 2015 after a fall.  She simply fell backwards.  CT of the head showed chronic small vessel ischemic changes.  CT of cervical spine showed no fracture.  Since the fall, she has had episodes or recurrent dizziness.  She describes the dizziness as a sense of movement but no true spinning.  There is no associated nausea.  It is triggered when she turns or otherwise moves her head.  It lasts for just a couple of seconds.  She tried meclizine which didn't work well.  She had carotid doppler performed in July, which revealed no hemodynamically significant stenosis in the bilateral ICA and vertebral arteries.  MRA of the head and neck performed in August showed no hemodynamically significant intracranial or extracranial  arterial stenosis.   In January 2016, she was sitting and eating dinner when she suddenly developed blurred vision only in the right eye.  There was no clouding or darkening of vision.  There was no associated headache, facial droop or focal numbness or weakness.  She saw the eye doctor who told her she had dry eyes and was prescribed steroid drops, which has helped.  Sometimes, it would still occur off and on but only briefly.   MRI of brain from 05/12/14 showed moderate chronic small vessel disease and cerebral atrophy, but nothing acute.   She also has a history of lumbar spinal stenosis with claudication.  She takes gabapentin for the left leg pain.  MRI of lumbar spine from 09/14/14 showed stable multilevel spondylosis and mild to moderate multifactorial spinal stenosis at L3-4 with left foraminal narrowing greatest at L5-S1.  She continues to have falls, but have progressively gotten worse.  However, they are not accompanied by dizziness or lightheadedness.  When she is on her feet, she feels very unsteady.  Any turn of her body causes her to tip backwards.  She has had frequent falls where she has hit the back of her head several times.  She does not think she passes out.  She feels "funny" in the head and has had some memory problems on concussion tests.  She denies difficulty feeling the ground.  She has not had episodes of facial droop, slurred speech or unilateral weakness.  PAST MEDICAL HISTORY: Past Medical History:  Diagnosis Date  . Allergy   . Arthritis   . Hyperlipidemia   .  Hypertension     MEDICATIONS: Current Outpatient Prescriptions on File Prior to Visit  Medication Sig Dispense Refill  . aspirin 81 MG tablet Take 81 mg by mouth daily.    . calcium acetate (PHOSLO) 667 MG capsule Take 667 mg by mouth 3 (three) times daily with meals.    . cetirizine (ZYRTEC) 10 MG tablet Take 10 mg by mouth daily.    . CRESTOR 20 MG tablet Take 20 mg by mouth daily.    . fluticasone  (FLONASE) 50 MCG/ACT nasal spray Place 2 sprays into both nostrils daily. 16 g 6  . gabapentin (NEURONTIN) 300 MG capsule TAKE ONE CAPSULE BY MOUTH ONCE DAILY IN THE EVENING 90 capsule 1  . lisinopril-hydrochlorothiazide (PRINZIDE,ZESTORETIC) 10-12.5 MG per tablet Take 10-12.5 tablets by mouth daily.    . Multiple Vitamin (MULTIVITAMIN) capsule Take 1 capsule by mouth daily.    . fish oil-omega-3 fatty acids 1000 MG capsule Take 2 g by mouth daily.     No current facility-administered medications on file prior to visit.     ALLERGIES: No Known Allergies  FAMILY HISTORY: Family History  Problem Relation Age of Onset  . Heart disease Mother   . Heart disease Father     SOCIAL HISTORY: Social History   Social History  . Marital status: Married    Spouse name: N/A  . Number of children: N/A  . Years of education: N/A   Occupational History  . Not on file.   Social History Main Topics  . Smoking status: Never Smoker  . Smokeless tobacco: Never Used  . Alcohol use Yes     Comment: socially  . Drug use: No  . Sexual activity: No   Other Topics Concern  . Not on file   Social History Narrative  . No narrative on file    REVIEW OF SYSTEMS: Constitutional: No fevers, chills, or sweats, no generalized fatigue, change in appetite Eyes: No visual changes, double vision, eye pain Ear, nose and throat: No hearing loss, ear pain, nasal congestion, sore throat Cardiovascular: No chest pain, palpitations Respiratory:  No shortness of breath at rest or with exertion, wheezes GastrointestinaI: No nausea, vomiting, diarrhea, abdominal pain, fecal incontinence Genitourinary:  No dysuria, urinary retention or frequency Musculoskeletal:  back pain Integumentary: No rash, pruritus, skin lesions Neurological: as above Psychiatric: No depression, insomnia, anxiety Endocrine: No palpitations, fatigue, diaphoresis, mood swings, change in appetite, change in weight, increased  thirst Hematologic/Lymphatic:  No purpura, petechiae. Allergic/Immunologic: no itchy/runny eyes, nasal congestion, recent allergic reactions, rashes  PHYSICAL EXAM: Vitals:   11/12/15 0908  BP: 134/76  Pulse: 83   General: No acute distress.  Patient appears well-groomed.  normal body habitus. Head:  Normocephalic/atraumatic Eyes:  Fundi examined but not visualized Neck: supple, no paraspinal tenderness, full range of motion Heart:  Regular rate and rhythm Lungs:  Clear to auscultation bilaterally Back: No paraspinal tenderness Neurological Exam: alert and oriented to person, place, and time. Attention span and concentration intact, recent and remote memory intact, fund of knowledge intact.  Speech fluent and not dysarthric, language intact.  CN II-XII intact. Bulk and tone normal, muscle strength 5/5 throughout.  Sensation to pinprick and vibration reduced in toes and feet up to ankle bilaterally.  Deep tendon reflexes 2+ throughout except absent in left patellar, toes downgoing.  Hoffman's sign absent.  Finger to nose and heel to shin testing intact.  Cautious wide-based gait.  Difficulty turning around.  Cannot tandem walk.  IMPRESSION: Frequent falls with evidence of cervical spondylosis with myelopathy, peripheral neuropathy and vestibulopathy.    PLAN: I  can't say for sure that the meclizine is what is preventing falls, as she did not describe any associated dizziness with these falls.  But she has been doing well.  Follow up as needed.  Take care to prevent falls.  Use walker outside house.  25 minutes spent face to face with patient, over 50% spent counseling.  Shon Millet, DO  CC:  Bill Salinas, NP

## 2016-09-15 ENCOUNTER — Other Ambulatory Visit: Payer: Self-pay | Admitting: Nurse Practitioner

## 2016-09-15 DIAGNOSIS — S22000A Wedge compression fracture of unspecified thoracic vertebra, initial encounter for closed fracture: Secondary | ICD-10-CM

## 2016-09-23 ENCOUNTER — Other Ambulatory Visit (HOSPITAL_COMMUNITY): Payer: Self-pay | Admitting: Interventional Radiology

## 2016-09-23 ENCOUNTER — Other Ambulatory Visit: Payer: Self-pay | Admitting: Nurse Practitioner

## 2016-09-23 ENCOUNTER — Ambulatory Visit
Admission: RE | Admit: 2016-09-23 | Discharge: 2016-09-23 | Disposition: A | Discharge status: Home / Self Care | Payer: Medicare Other | Source: Ambulatory Visit | Attending: Nurse Practitioner | Admitting: Nurse Practitioner

## 2016-09-23 DIAGNOSIS — S22000A Wedge compression fracture of unspecified thoracic vertebra, initial encounter for closed fracture: Secondary | ICD-10-CM

## 2016-09-23 HISTORY — PX: IR RADIOLOGIST EVAL & MGMT: IMG5224

## 2016-09-23 NOTE — Consult Note (Signed)
Chief Complaint: Patient was seen in consultation today for  Chief Complaint  Patient presents with  . Advice Only    Consult for Kyphoplasty:  T 9 & T12   at the request of Turbyfill,Holly I  Referring Physician(s): Turbyfill,Holly I  History of Present Illness: [name] is a 83 y.o. female with a several week history of severe thoracic spine pain.  She has been having balance issues for some time and suffered a recent fall while trying to use the bathroom at night.  She fell backward onto her back and head.    She was diagnosed with acute T9 and T12 compression fractures.  She's been on conservative therapy with a back brace and Percocet Q4 hrs for pain. Near the end of the 4 hours, her pain rates a 9 out of 10 on a 10 point scale.  During the maximal effect of the medications her pain is more manageable at a 6 out of 10, but only if she is minimally active.  She and her family estimate that her activity level is only 10-15% of what she had been before her fall.   Furthermore, she had a fall last year in which she sustained 2 upper thoracic fractures and endured conservative therapy for months.  She dreads the thought of having to struggle with pain for weeks or months again.    She denies paresthesia, weakness, or incontinence.   Past Medical History:  Diagnosis Date  . Allergy   . Arthritis   . Hyperlipidemia   . Hypertension     Past Surgical History:  Procedure Laterality Date  . AORTA SURGERY  03/03/2010  . CYST REMOVAL TRUNK      Allergies: Patient has no known allergies.  Medications: Prior to Admission medications   Medication Sig Start Date End Date Taking? Authorizing Provider  aspirin 81 MG tablet Take 81 mg by mouth daily.   Yes [provider]  calcium acetate (PHOSLO) 667 MG capsule Take 667 mg by mouth 3 (three) times daily with meals.   Yes [provider]  cetirizine (ZYRTEC) 10 MG tablet Take 10 mg by mouth daily.   Yes  [provider]  CRESTOR 20 MG tablet Take 20 mg by mouth daily. 06/14/12  Yes [provider]  fluticasone (FLONASE) 50 MCG/ACT nasal spray Place 2 sprays into both nostrils daily. 01/15/15  Yes Judi Saa, DO  meclizine (ANTIVERT) 25 MG tablet  08/07/15  Yes [provider]  Multiple Vitamin (MULTIVITAMIN) capsule Take 1 capsule by mouth daily.   Yes [provider]  oxyCODONE-acetaminophen (PERCOCET) 7.5-325 MG tablet Take 1 tablet by mouth every 6 (six) hours as needed for severe pain.   Yes [provider]  fish oil-omega-3 fatty acids 1000 MG capsule Take 2 g by mouth daily.    [provider]  gabapentin (NEURONTIN) 300 MG capsule TAKE ONE CAPSULE BY MOUTH ONCE DAILY IN THE EVENING Patient not taking: Reported on 09/23/2016 10/08/15   Judi Saa, DO  lisinopril-hydrochlorothiazide (PRINZIDE,ZESTORETIC) 10-12.5 MG per tablet Take 10-12.5 tablets by mouth daily. 05/13/12   [provider]     Family History  Problem Relation Age of Onset  . Heart disease Mother   . Heart disease Father     Social History   Social History  . Marital status: Married    Spouse name: N/A  . Number of children: N/A  . Years of education: N/A   Social History  Main Topics  . Smoking status: Never Smoker  . Smokeless tobacco: Never Used  . Alcohol use Yes     Comment: socially  . Drug use: No  . Sexual activity: No   Other Topics Concern  . Not on file   Social History Narrative  . No narrative on file     Review of Systems: A 12 point ROS discussed and pertinent positives are indicated in the HPI above.  All other systems are negative.  Review of Systems  Vital Signs: BP (!) 177/77   Pulse 78   Temp 98 F (36.7 C) (Oral)   Resp 14   Ht 5\' 5"  (1.651 m)   Wt 110 lb (49.9 kg)   SpO2 96%   BMI 18.30 kg/m   Physical Exam  Constitutional: She is oriented to person, place, and time. She appears well-developed and  well-nourished. No distress.  HENT:  Head: Normocephalic and atraumatic.  Eyes: No scleral icterus.  Cardiovascular: Normal rate.   Pulmonary/Chest: Effort normal.  Abdominal: Soft. She exhibits no distension. There is no tenderness.  Musculoskeletal:       Back:  +TTP overlying the T9 and T12 spinous processes  Neurological: She is alert and oriented to person, place, and time.  Skin: Skin is warm and dry.  Psychiatric: She has a normal mood and affect. Her behavior is normal.  Nursing note and vitals reviewed.    Imaging: No results found.  Labs:  CBC: No results for input(s): WBC, HGB, HCT, PLT in the last 8760 hours.  COAGS: No results for input(s): INR, APTT in the last 8760 hours.  BMP: No results for input(s): NA, K, CL, CO2, GLUCOSE, BUN, CALCIUM, CREATININE, GFRNONAA, GFRAA in the last 8760 hours.  Invalid input(s): CMP  LIVER FUNCTION TESTS: No results for input(s): BILITOT, AST, ALT, ALKPHOS, PROT, ALBUMIN in the last 8760 hours.  TUMOR MARKERS: No results for input(s): AFPTM, CEA, CA199, CHROMGRNA in the last 8760 hours.  Assessment and Plan:  Acute T9 and T12 compression fractures resulting in significantly decreased activity and quality of life. She is an excellent candidate for cement augmentation of both levels.   1.) [facility] T12 [facility].  Pt understands she may need to be treated by one of my partners in order to facilitate her care.  Thank you for this interesting consult.  I greatly enjoyed meeting Madlyn FrankelJuanita B Obey and look forward to participating in their care.  A copy of this report was sent to the requesting provider on this date.  Electronically Signed: Malachy MoanMCCULLOUGH, Kang Ishida 09/23/2016, 5:19 PM   I spent a total of  40 Minutes  in face to face in clinical consultation, greater than 50% of which was counseling/coordinating care for acute thoracic spine compression fractures.

## 2016-09-30 ENCOUNTER — Other Ambulatory Visit: Payer: Self-pay | Admitting: Physician Assistant

## 2016-10-01 ENCOUNTER — Ambulatory Visit (HOSPITAL_COMMUNITY)
Admission: RE | Admit: 2016-10-01 | Discharge: 2016-10-01 | Disposition: A | Discharge status: Home / Self Care | Payer: Medicare Other | Source: Ambulatory Visit | Attending: Interventional Radiology | Admitting: Interventional Radiology

## 2016-10-01 ENCOUNTER — Other Ambulatory Visit (HOSPITAL_COMMUNITY): Payer: Self-pay | Admitting: Interventional Radiology

## 2016-10-01 ENCOUNTER — Other Ambulatory Visit: Payer: Self-pay | Admitting: Radiology

## 2016-10-01 ENCOUNTER — Encounter (HOSPITAL_COMMUNITY): Payer: Self-pay

## 2016-10-01 DIAGNOSIS — E785 Hyperlipidemia, unspecified: Secondary | ICD-10-CM | POA: Diagnosis not present

## 2016-10-01 DIAGNOSIS — S22079A Unspecified fracture of T9-T10 vertebra, initial encounter for closed fracture: Secondary | ICD-10-CM | POA: Diagnosis present

## 2016-10-01 DIAGNOSIS — Z79899 Other long term (current) drug therapy: Secondary | ICD-10-CM | POA: Diagnosis not present

## 2016-10-01 DIAGNOSIS — S22080S Wedge compression fracture of T11-T12 vertebra, sequela: Secondary | ICD-10-CM

## 2016-10-01 DIAGNOSIS — Z7951 Long term (current) use of inhaled steroids: Secondary | ICD-10-CM | POA: Diagnosis not present

## 2016-10-01 DIAGNOSIS — Z7982 Long term (current) use of aspirin: Secondary | ICD-10-CM | POA: Diagnosis not present

## 2016-10-01 DIAGNOSIS — S22000A Wedge compression fracture of unspecified thoracic vertebra, initial encounter for closed fracture: Secondary | ICD-10-CM

## 2016-10-01 DIAGNOSIS — S22089A Unspecified fracture of T11-T12 vertebra, initial encounter for closed fracture: Secondary | ICD-10-CM | POA: Diagnosis not present

## 2016-10-01 DIAGNOSIS — Z79891 Long term (current) use of opiate analgesic: Secondary | ICD-10-CM | POA: Insufficient documentation

## 2016-10-01 DIAGNOSIS — W19XXXA Unspecified fall, initial encounter: Secondary | ICD-10-CM | POA: Diagnosis not present

## 2016-10-01 DIAGNOSIS — I10 Essential (primary) hypertension: Secondary | ICD-10-CM | POA: Diagnosis not present

## 2016-10-01 DIAGNOSIS — S22070S Wedge compression fracture of T9-T10 vertebra, sequela: Secondary | ICD-10-CM

## 2016-10-01 HISTORY — PX: IR KYPHO EA ADDL LEVEL THORACIC OR LUMBAR: IMG5520

## 2016-10-01 HISTORY — PX: IR KYPHO THORACIC WITH BONE BIOPSY: IMG5518

## 2016-10-01 LAB — CBC
HEMATOCRIT: 42.5 % (ref 36.0–46.0)
Hemoglobin: 14 g/dL (ref 12.0–15.0)
MCH: 30.8 pg (ref 26.0–34.0)
MCHC: 32.9 g/dL (ref 30.0–36.0)
MCV: 93.6 fL (ref 78.0–100.0)
Platelets: 270 10*3/uL (ref 150–400)
RBC: 4.54 MIL/uL (ref 3.87–5.11)
RDW: 14.8 % (ref 11.5–15.5)
WBC: 7.9 10*3/uL (ref 4.0–10.5)

## 2016-10-01 LAB — PROTIME-INR
INR: 0.95
Prothrombin Time: 12.7 seconds (ref 11.4–15.2)

## 2016-10-01 LAB — APTT: aPTT: 29 seconds (ref 24–36)

## 2016-10-01 MED ORDER — CEFAZOLIN SODIUM-DEXTROSE 2-4 GM/100ML-% IV SOLN
INTRAVENOUS | Status: AC
  Administered 2016-10-01: 2 g via INTRAVENOUS
  Filled 2016-10-01: qty 100

## 2016-10-01 MED ORDER — IOPAMIDOL (ISOVUE-300) INJECTION 61%
INTRAVENOUS | Status: AC
  Administered 2016-10-01: 20 mL
  Filled 2016-10-01: qty 50

## 2016-10-01 MED ORDER — LIDOCAINE HCL 1 % IJ SOLN
INTRAMUSCULAR | Status: AC | PRN
  Administered 2016-10-01: 15 mL

## 2016-10-01 MED ORDER — MIDAZOLAM HCL 2 MG/2ML IJ SOLN
INTRAMUSCULAR | Status: AC
  Filled 2016-10-01: qty 6

## 2016-10-01 MED ORDER — FENTANYL CITRATE (PF) 100 MCG/2ML IJ SOLN
INTRAMUSCULAR | Status: AC | PRN
  Administered 2016-10-01 (×4): 50 ug via INTRAVENOUS

## 2016-10-01 MED ORDER — FENTANYL CITRATE (PF) 100 MCG/2ML IJ SOLN
INTRAMUSCULAR | Status: AC
  Filled 2016-10-01: qty 4

## 2016-10-01 MED ORDER — LIDOCAINE HCL (PF) 1 % IJ SOLN
INTRAMUSCULAR | Status: AC
  Filled 2016-10-01: qty 30

## 2016-10-01 MED ORDER — MIDAZOLAM HCL 2 MG/2ML IJ SOLN
INTRAMUSCULAR | Status: AC | PRN
  Administered 2016-10-01 (×4): 1 mg via INTRAVENOUS

## 2016-10-01 MED ORDER — ONDANSETRON HCL 4 MG/2ML IJ SOLN
INTRAMUSCULAR | Status: AC
  Administered 2016-10-01: 10:00:00 via INTRAVENOUS
  Filled 2016-10-01: qty 2

## 2016-10-01 MED ORDER — SODIUM CHLORIDE 0.9 % IV SOLN
INTRAVENOUS | Status: DC
  Administered 2016-10-01: 08:00:00 via INTRAVENOUS

## 2016-10-01 MED ORDER — CEFAZOLIN SODIUM-DEXTROSE 2-4 GM/100ML-% IV SOLN
2.0000 g | Freq: Once | INTRAVENOUS | Status: AC
  Administered 2016-10-01: 2 g via INTRAVENOUS

## 2016-10-01 NOTE — Discharge Instructions (Signed)
You will be contacted in the next several days for a follow up appointment in 4 weeks with Interventional Radiology  Please call Interventional Radiology during [phone] or after hours [phone]  With any questions or concerns  Moderate Conscious Sedation, Adult, Care After These instructions provide you with information about caring for yourself after your procedure. Your health care provider may also give you more specific instructions. Your treatment has been planned according to current medical practices, but problems sometimes occur. Call your health care provider if you have any problems or questions after your procedure. What can I expect after the procedure? After your procedure, it is common:  To feel sleepy for several hours.  To feel clumsy and have poor balance for several hours.  To have poor judgment for several hours.  To vomit if you eat too soon.  Follow these instructions at home: For at least 24 hours after the procedure:   Do not: ? Participate in activities where you could fall or become injured. ? Drive. ? Use heavy machinery. ? Drink alcohol. ? Take sleeping pills or medicines that cause drowsiness. ? Make important decisions or sign legal documents. ? Take care of children on your own.  Rest. Eating and drinking  Follow the diet recommended by your health care provider.  If you vomit: ? Drink water, juice, or soup when you can drink without vomiting. ? Make sure you have little or no nausea before eating solid foods. General instructions  Have a responsible adult stay with you until you are awake and alert.  Take over-the-counter and prescription medicines only as told by your health care provider.  If you smoke, do not smoke without supervision.  Keep all follow-up visits as told by your health care provider. This is important. Contact a health care provider if:  You keep feeling nauseous or you keep vomiting.  You feel  light-headed.  You develop a rash.  You have a fever. Get help right away if:  You have trouble breathing. This information is not intended to replace advice given to you by your health care provider. Make sure you discuss any questions you have with your health care provider. Document Released: 12/08/2012 Document Revised: 07/23/2015 Document Reviewed: 06/09/2015 Elsevier Interactive Patient Education  2018 [employer]   Balloon Kyphoplasty, Care After Refer to this sheet in the next few weeks. These instructions provide you with information about caring for yourself after your procedure. Your health care provider may also give you more specific instructions. Your treatment has been planned according to current medical practices, but problems sometimes occur. Call your health care provider if you have any problems or questions after your procedure. What can I expect after the procedure? After your procedure, it is common to have back pain. Follow these instructions at home: Incision care  Follow instructions from your health care provider about how to take care of your incisions. Make sure you: ? Wash your hands with soap and water before you change your bandage (dressing). If soap and water are not available, use hand sanitizer. ? Change your dressing as told by your health care provider.  Remove dressing tomorrow ? Leave stitches (sutures), skin glue, or adhesive strips in place. These skin closures may need to be in place for 2 weeks or longer. If adhesive strip edges start to loosen and curl up, you may trim the loose edges. Do not remove adhesive strips completely unless your health care provider tells you to do that.  Check  your incision area every day for signs of infection. Watch for: ? Redness, swelling, or pain. ? Fluid, blood, or pus.  Keep your dressing dry until your health care provider says that it can be removed. Activity   Rest your back and avoid intense  physical activity for as long as told by your health care provider.  Return to your normal activities as told by your health care provider. Ask your health care provider what activities are safe for you.  Do not lift anything that is heavier than 10 lb (4.5 kg). This is about the weight of a gallon of milk.You may need to avoid heavy lifting for several weeks. General instructions  Take over-the-counter and prescription medicines only as told by your health care provider.  If directed, apply ice to the painful area: ? Put ice in a plastic bag. ? Place a towel between your skin and the bag. ? Leave the ice on for 20 minutes, 2-3 times per day.  Do not use tobacco products, including cigarettes, chewing tobacco, or e-cigarettes. If you need help quitting, ask your health care provider.  Keep all follow-up visits as told by your health care provider. This is important. Contact a health care provider if:  You have a fever.  You have redness, swelling, or pain at the site of your incisions.  You have fluid, blood, or pus coming from your incisions.  You have pain that gets worse or does not get better with medicine.  You develop numbness or weakness in any part of your body. Get help right away if:  You have chest pain.  You have difficulty breathing.  You cannot move your legs.  You cannot control your bladder or bowel movements.  You suddenly become weak or numb on one side of your body.  You become very confused.  You have trouble speaking or understanding, or both. This information is not intended to replace advice given to you by your health care provider. Make sure you discuss any questions you have with your health care provider. Document Released: 11/08/2014 Document Revised: 07/26/2015 Document Reviewed: 06/12/2014 Elsevier Interactive Patient Education  [employer]2018 Elsevier Inc.

## 2016-10-01 NOTE — H&P (Signed)
Referring Physician(s): Turbyfill,H,NP  Supervising Physician: Oley BalmHassell, Daniel  Patient Status:  [facility]  Chief Complaint: Back pain, thoracic compression fractures   Subjective: Patient familiar to IR service from recent consultation with Dr. Archer AsaMcCullough on 09/23/16 to discuss treatment options for symptomatic newly diagnosed T9 and T12 compression fractures. She was deemed an appropriate candidate for vertebral body augmentation and presents today for the procedures. Additional history as listed below. She currently denies fever, chest pain, dyspnea, cough, abdominal pain, nausea, vomiting, bowel/bladder dysfunction, extremity paresthesias or abnormal bleeding. She does have occasional headaches and mid back pain. Past Medical History:  Diagnosis Date  . Allergy   . Arthritis   . Hyperlipidemia   . Hypertension    Past Surgical History:  Procedure Laterality Date  . AORTA SURGERY  03/03/2010  . CYST REMOVAL TRUNK        Allergies: Patient has no known allergies.  Medications: Prior to Admission medications   Medication Sig Start Date End Date Taking? Authorizing Provider  acetaminophen (TYLENOL) 500 MG tablet Take 500 mg by mouth every 6 (six) hours as needed.   Yes [provider]  CRESTOR 20 MG tablet Take 20 mg by mouth daily. 06/14/12  Yes [provider]  fluticasone (FLONASE) 50 MCG/ACT nasal spray Place 2 sprays into both nostrils daily. 01/15/15  Yes Judi SaaSmith, Zachary M, DO  meclizine (ANTIVERT) 25 MG tablet  08/07/15  Yes [provider]  oxyCODONE-acetaminophen (PERCOCET) 7.5-325 MG tablet Take 1 tablet by mouth every 6 (six) hours as needed for severe pain.   Yes [provider]  aspirin 81 MG tablet Take 81 mg by mouth daily.    [provider]  calcium acetate (PHOSLO) 667 MG capsule Take 667 mg by mouth 3 (three) times daily with meals.    [provider]  cetirizine (ZYRTEC) 10 MG tablet Take 10 mg by mouth  daily.    [provider]  fish oil-omega-3 fatty acids 1000 MG capsule Take 2 g by mouth daily.    [provider]  gabapentin (NEURONTIN) 300 MG capsule TAKE ONE CAPSULE BY MOUTH ONCE DAILY IN THE EVENING Patient not taking: Reported on 09/23/2016 10/08/15   Judi SaaSmith, Zachary M, DO  lisinopril-hydrochlorothiazide (PRINZIDE,ZESTORETIC) 10-12.5 MG per tablet Take 10-12.5 tablets by mouth daily. 05/13/12   [provider]  Multiple Vitamin (MULTIVITAMIN) capsule Take 1 capsule by mouth daily.    [provider]     Vital Signs: BP (!) 129/93 (BP Location: Right Arm)   Pulse 82   Temp 98.2 F (36.8 C) (Oral)   Resp 16   SpO2 92%   Physical Exam awake, alert. Chest clear to auscultation bilaterally. Heart with regular rate and rhythm. Abdomen soft, positive bowel sounds, nontender. Lower extremities with full range of motion, no significant edema. Mild to moderate paravertebral tenderness T 9/ T12 regions  Imaging: No results found.  Labs:  CBC:  Recent Labs  10/01/16 0745  WBC 7.9  HGB 14.0  HCT 42.5  PLT 270    COAGS:  Recent Labs  10/01/16 0745  INR 0.95  APTT 29    BMP: No results for input(s): NA, K, CL, CO2, GLUCOSE, BUN, CALCIUM, CREATININE, GFRNONAA, GFRAA in the last 8760 hours.  Invalid input(s): CMP  LIVER FUNCTION TESTS: No results for input(s): BILITOT, AST, ALT, ALKPHOS, PROT, ALBUMIN in the last 8760 hours.  Assessment and Plan: Patient with history of recent fall with subsequent acute compression fractures at T9  and T12 regions; seen in consultation by Dr. Archer AsaMcCullough on 09/23/16 and deemed an appropriate candidate for T9/T12 vertebral body augmentation. She presents today for the procedure.Risks and benefits discussed with the patient/family including, but not limited to education regarding the natural healing process of compression fractures without intervention, bleeding, infection, cement migration which may cause spinal  cord damage, paralysis, pulmonary embolism or even death.All of the patient's questions were answered, patient is agreeable to proceed.Consent signed and in chart.      Electronically Signed: D. Jeananne RamaKevin Allred, PA-C 10/01/2016, 8:47 AM   I spent a total of 25 minutes at the the patient's bedside AND on the patient's hospital floor or unit, greater than 50% of which was counseling/coordinating care for T9/T12 vertebral body augmentation

## 2016-10-01 NOTE — Procedures (Signed)
  Procedure:   KP T9 and T12    Preprocedure diagnosis:  Subacute compression fractures, painful Postprocedure diagnosis:  same EBL:     minimal Complications:   none immediate  See full dictation in [facility] PACS.  Thora Lance. Max Romano MD Main # [phone] Pager  [phone]

## 2016-10-22 ENCOUNTER — Other Ambulatory Visit: Payer: Self-pay | Admitting: Nurse Practitioner

## 2016-10-22 DIAGNOSIS — Z1231 Encounter for screening mammogram for malignant neoplasm of breast: Secondary | ICD-10-CM

## 2016-10-30 ENCOUNTER — Ambulatory Visit
Admission: RE | Admit: 2016-10-30 | Discharge: 2016-10-30 | Disposition: A | Discharge status: Home / Self Care | Payer: Medicare Other | Source: Ambulatory Visit | Attending: Radiology | Admitting: Radiology

## 2016-10-30 DIAGNOSIS — S22080S Wedge compression fracture of T11-T12 vertebra, sequela: Secondary | ICD-10-CM

## 2016-10-30 DIAGNOSIS — S22070S Wedge compression fracture of T9-T10 vertebra, sequela: Secondary | ICD-10-CM

## 2016-10-30 HISTORY — PX: IR RADIOLOGIST EVAL & MGMT: IMG5224

## 2016-10-30 NOTE — Progress Notes (Signed)
Patient ID: [name], female   DOB: Jul 03, 1931, 83 y.o.   MRN: [id number]       Chief Complaint: Patient was seen in consultation today for scheduled postprocedure four-week follow-up at the request of Allred,Darrell K  Referring Physician(s): [place]  History of Present Illness: [name] is a 83 y.o. female who returns for her scheduled four-week follow-up does post thoracic T9 and T12 kyphoplasty 10/01/2016 at [facility]. She came accompanied by husband and daughter.  She's done well post procedure. She had some residual bandlike lower thoracic pain for the first 3 weeks post procedure which required use of her pain medication especially in the morning. This week she's had no pain requiring anything more than Tylenol. No new pain. No radicular symptoms. No weakness. She is able to ambulate at her baseline level with a walker. No pain with rising from a chair or ambulation. She continues on her prescribed bone building treatment regimen.  Past Medical History:  Diagnosis Date  . Allergy   . Arthritis   . Hyperlipidemia   . Hypertension     Past Surgical History:  Procedure Laterality Date  . AORTA SURGERY  03/03/2010  . CYST REMOVAL TRUNK    . IR KYPHO EA ADDL LEVEL THORACIC OR LUMBAR  10/01/2016  . IR KYPHO THORACIC WITH BONE BIOPSY  10/01/2016   T9, T 12    Allergies: Patient has no known allergies.  Medications: Prior to Admission medications   Medication Sig Start Date End Date Taking? Authorizing Provider  acetaminophen (TYLENOL) 500 MG tablet Take 500 mg by mouth every 6 (six) hours as needed.   Yes [provider]  aspirin 81 MG tablet Take 81 mg by mouth daily.   Yes [provider]  calcium acetate (PHOSLO) 667 MG capsule Take 667 mg by mouth 3 (three) times daily with meals.   Yes [provider]  CRESTOR 20 MG tablet Take 20 mg by mouth daily. 06/14/12  Yes [provider]  gabapentin (NEURONTIN) 300 MG  capsule TAKE ONE CAPSULE BY MOUTH ONCE DAILY IN THE EVENING 10/08/15  Yes Judi Saa, DO  Multiple Vitamin (MULTIVITAMIN) capsule Take 1 capsule by mouth daily.   Yes [provider]  oxyCODONE-acetaminophen (PERCOCET) 7.5-325 MG tablet Take 1 tablet by mouth every 6 (six) hours as needed for severe pain.   Yes [provider]  meclizine (ANTIVERT) 25 MG tablet  08/07/15   [provider]     Family History  Problem Relation Age of Onset  . Heart disease Mother   . Heart disease Father     Social History   Social History  . Marital status: Married    Spouse name: N/A  . Number of children: N/A  . Years of education: N/A   Social History Main Topics  . Smoking status: Never Smoker  . Smokeless tobacco: Never Used  . Alcohol use Yes     Comment: socially  . Drug use: No  . Sexual activity: No   Other Topics Concern  . None   Social History Narrative  . None    ECOG Status: 1 - Symptomatic but completely ambulatory  Review of Systems: A 12 point ROS discussed and pertinent positives are indicated in the HPI above.  All other systems are negative.  Review of Systems  Vital Signs: BP (!) 141/89 (BP Location: Right Arm, Patient Position: Sitting, Cuff Size: Normal)   Pulse 74   Temp 97.9 F (  36.6 C)   Resp 14   SpO2 96%   Physical Exam Constitutional: Oriented to person, place, and time. Thin, Well-developed and well-nourished. No distress.  HENT:  Head: Normocephalic and atraumatic.  Eyes: Conjunctivae and EOM are normal. Right eye exhibits no discharge. Left eye exhibits no discharge. No scleral icterus.  Neck: No JVD present.  Pulmonary/Chest: Effort normal. No stridor. No respiratory distress.  Abdomen: soft, non distended Neurological:  alert and oriented to person, place, and time.  Skin: Skin is warm and dry.  not diaphoretic.  Psychiatric:   normal mood and affect.   behavior is normal. Judgment and thought content normal.    Ambulates easily with rolling walker. Mallampati Score:     Imaging: Ir Kypho Thoracic With Bone Biopsy  Result Date: 10/01/2016 CLINICAL DATA:  Lower thoracic pain. Previous fall several weeks ago ; MR demonstrates subacute compression fractures of T9 and T12. See previous consultation. EXAM: 1.  KYPHOPLASTY AT THORACIC T9 2.  KYPHOPLASTY AT THORACIC T12 TECHNIQUE: The procedure, risks (including but not limited to bleeding, infection, organ damage), benefits, and alternatives were explained to the patient and family. Questions regarding the procedure were encouraged and answered. The patient understands and consents to the procedure. The patient was placed prone on the fluoroscopic table. The skin overlying the lower thoracic region was then prepped and draped in the usual sterile fashion. Maximal barrier sterile technique was utilized including caps, mask, sterile gowns, sterile gloves, sterile drape, hand hygiene and skin antiseptic. Intravenous Fentanyl and Versed were administered as conscious sedation during continuous cardiorespiratory monitoring by the radiology RN, with a total moderate sedation time of 40 minutes. As antibiotic prophylaxis, cefazolin 2 g was ordered pre-procedure and administered intravenously within !one hour! of incision. The right pedicle at thoracic T12 was then infiltrated with 1% lidocaine followed by the advancement of a Kyphon trocar needle through the right pedicle into the posterior one-third. The trocar was removed and the osteo drill was advanced to the anterior third of the vertebral body. The osteo drill was retracted. Through the working cannula, a Kyphon inflatable bone tamp 15 x 2 was advanced and positioned with the distal marker 5 mm from the anterior aspect of the cortex. Crossing of the midline was seen on the AP projection. At this time, the balloon was expanded using contrast via a Kyphon inflation syringe device via micro tubing. High pressures prevented  significant balloon expansion. In similar fashion, the right pedicle of thoracic T9 was infiltrated with 1% lidocaine followed by the advancement of a second Kyphon trocar needle through the pedicle into the posterior third of the vertebral body. The osteo drill was coaxially advanced to the anterior third. The osteo drill was exchanged for a Kyphon inflatable bone tamp 15 x 2, advanced to the 5 mm of the anterior aspect of the cortex. The balloon was then expanded using contrast as above. Crossing of the midline was seen on the AP projection. At this time, the balloon was expanded using contrast via a Kyphon inflation syringe device via micro tubing. Inflation continued until there was near apposition across the midline and with the superior and the inferior end plates. At this time, methylmethacrylate mixture was reconstituted in the Kyphon bone mixing device system. This was then loaded into the delivery mechanism, attached to Kyphon bone fillers. The balloons were deflated and removed followed by the instillation of methylmethacrylate mixture with excellent filling in the AP and lateral projections at both T9 and T12. No extravasation  was noted in the disk spaces or posteriorly into the spinal canal. No epidural venous contamination was seen. The patient tolerated the procedure well. There were no acute complications. The working cannulae and the bone filler were then retrieved and removed. COMPLICATIONS: COMPLICATIONS None immediate. IMPRESSION: 1. Status post vertebral body augmentation using balloon kyphoplasty at thoracic T9 as described without event. 2. Status post vertebral body augmentation using balloon kyphoplasty at thoracic T12 as described without event. 3. Per CMS PQRS reporting requirements (PQRS Measure 24): Given the patient's age of greater than 50 and the fracture site (hip, distal radius, or spine), the patient should be tested for osteoporosis using DXA, and the appropriate treatment  considered based on the DXA results. Electronically Signed   By: Corlis Leak  Jakavion Bilodeau M.D.   On: 10/01/2016 12:24   Ir Kypho Ea Addl Level Thoracic Or Lumbar  Result Date: 10/01/2016 CLINICAL DATA:  Lower thoracic pain. Previous fall several weeks ago ; MR demonstrates subacute compression fractures of T9 and T12. See previous consultation. EXAM: 1.  KYPHOPLASTY AT THORACIC T9 2.  KYPHOPLASTY AT THORACIC T12 TECHNIQUE: The procedure, risks (including but not limited to bleeding, infection, organ damage), benefits, and alternatives were explained to the patient and family. Questions regarding the procedure were encouraged and answered. The patient understands and consents to the procedure. The patient was placed prone on the fluoroscopic table. The skin overlying the lower thoracic region was then prepped and draped in the usual sterile fashion. Maximal barrier sterile technique was utilized including caps, mask, sterile gowns, sterile gloves, sterile drape, hand hygiene and skin antiseptic. Intravenous Fentanyl and Versed were administered as conscious sedation during continuous cardiorespiratory monitoring by the radiology RN, with a total moderate sedation time of 40 minutes. As antibiotic prophylaxis, cefazolin 2 g was ordered pre-procedure and administered intravenously within !one hour! of incision. The right pedicle at thoracic T12 was then infiltrated with 1% lidocaine followed by the advancement of a Kyphon trocar needle through the right pedicle into the posterior one-third. The trocar was removed and the osteo drill was advanced to the anterior third of the vertebral body. The osteo drill was retracted. Through the working cannula, a Kyphon inflatable bone tamp 15 x 2 was advanced and positioned with the distal marker 5 mm from the anterior aspect of the cortex. Crossing of the midline was seen on the AP projection. At this time, the balloon was expanded using contrast via a Kyphon inflation syringe device via  micro tubing. High pressures prevented significant balloon expansion. In similar fashion, the right pedicle of thoracic T9 was infiltrated with 1% lidocaine followed by the advancement of a second Kyphon trocar needle through the pedicle into the posterior third of the vertebral body. The osteo drill was coaxially advanced to the anterior third. The osteo drill was exchanged for a Kyphon inflatable bone tamp 15 x 2, advanced to the 5 mm of the anterior aspect of the cortex. The balloon was then expanded using contrast as above. Crossing of the midline was seen on the AP projection. At this time, the balloon was expanded using contrast via a Kyphon inflation syringe device via micro tubing. Inflation continued until there was near apposition across the midline and with the superior and the inferior end plates. At this time, methylmethacrylate mixture was reconstituted in the Kyphon bone mixing device system. This was then loaded into the delivery mechanism, attached to Kyphon bone fillers. The balloons were deflated and removed followed by the instillation of methylmethacrylate mixture  with excellent filling in the AP and lateral projections at both T9 and T12. No extravasation was noted in the disk spaces or posteriorly into the spinal canal. No epidural venous contamination was seen. The patient tolerated the procedure well. There were no acute complications. The working cannulae and the bone filler were then retrieved and removed. COMPLICATIONS: COMPLICATIONS None immediate. IMPRESSION: 1. Status post vertebral body augmentation using balloon kyphoplasty at thoracic T9 as described without event. 2. Status post vertebral body augmentation using balloon kyphoplasty at thoracic T12 as described without event. 3. Per CMS PQRS reporting requirements (PQRS Measure 24): Given the patient's age of greater than 50 and the fracture site (hip, distal radius, or spine), the patient should be tested for osteoporosis using  DXA, and the appropriate treatment considered based on the DXA results. Electronically Signed   By: Corlis Leak M.D.   On: 10/01/2016 12:24    Labs:  CBC:  Recent Labs  10/01/16 0745  WBC 7.9  HGB 14.0  HCT 42.5  PLT 270    COAGS:  Recent Labs  10/01/16 0745  INR 0.95  APTT 29    BMP: No results for input(s): NA, K, CL, CO2, GLUCOSE, BUN, CALCIUM, CREATININE, GFRNONAA, GFRAA in the last 8760 hours.  Invalid input(s): CMP  LIVER FUNCTION TESTS: No results for input(s): BILITOT, AST, ALT, ALKPHOS, PROT, ALBUMIN in the last 8760 hours.  TUMOR MARKERS: No results for input(s): AFPTM, CEA, CA199, CHROMGRNA in the last 8760 hours.  Assessment and Plan: My impression is that she has had excellent relief after thoracic T9 and T12 kyphoplasty, with no significant residual symptoms or postprocedural complications.  We again reviewed the pathophysiology of vertebral compression fractures and the likelihood of elevated risk of recurrent fracture with or without previous kyphoplasty due to underlying osteopenia. We reviewed the importance of physical activity and concomitant bone building therapy.  I would not need to see her back on a scheduled basis but I'm happy to see her back as needed should she have any new thoracic or lumbar symptoms.  Thank you for this interesting consult.  I greatly enjoyed meeting [name] and look forward to participating in their care.  A copy of this report was sent to the requesting provider on this date.  Electronically Signed: Chrisandra Wiemers III, DAYNE Corrine Tillis 10/30/2016, 10:03 AM   I spent a total of    25 Minutes in face to face in clinical consultation, greater than 50% of which was counseling/coordinating care for thoracic compression fractures.

## 2016-11-12 ENCOUNTER — Ambulatory Visit (INDEPENDENT_AMBULATORY_CARE_PROVIDER_SITE_OTHER): Payer: Medicare Other

## 2016-11-12 DIAGNOSIS — Z1231 Encounter for screening mammogram for malignant neoplasm of breast: Secondary | ICD-10-CM | POA: Diagnosis not present

## 2016-12-15 ENCOUNTER — Encounter: Payer: Self-pay | Admitting: Interventional Radiology

## 2017-02-11 ENCOUNTER — Encounter: Payer: Self-pay | Admitting: Gastroenterology

## 2017-02-23 ENCOUNTER — Ambulatory Visit: Payer: Medicare Other | Admitting: Gastroenterology

## 2017-04-17 ENCOUNTER — Other Ambulatory Visit: Payer: Self-pay

## 2017-04-17 DIAGNOSIS — G458 Other transient cerebral ischemic attacks and related syndromes: Secondary | ICD-10-CM

## 2017-05-13 ENCOUNTER — Ambulatory Visit (HOSPITAL_COMMUNITY)
Admission: RE | Admit: 2017-05-13 | Discharge: 2017-05-13 | Disposition: A | Discharge status: Home / Self Care | Payer: Medicare Other | Source: Ambulatory Visit | Attending: Vascular Surgery | Admitting: Vascular Surgery

## 2017-05-13 ENCOUNTER — Ambulatory Visit (INDEPENDENT_AMBULATORY_CARE_PROVIDER_SITE_OTHER): Payer: Medicare Other | Admitting: Vascular Surgery

## 2017-05-13 ENCOUNTER — Encounter: Payer: Self-pay | Admitting: Vascular Surgery

## 2017-05-13 DIAGNOSIS — I779 Disorder of arteries and arterioles, unspecified: Secondary | ICD-10-CM

## 2017-05-13 DIAGNOSIS — I739 Peripheral vascular disease, unspecified: Secondary | ICD-10-CM | POA: Insufficient documentation

## 2017-05-13 DIAGNOSIS — G458 Other transient cerebral ischemic attacks and related syndromes: Secondary | ICD-10-CM

## 2017-05-13 NOTE — Progress Notes (Signed)
Requested by:  Vernon Prey, NP [address] [place], [place] [address]  Reason for consultation: weak right pulse    History of Present Illness   [name] is a 84 y.o. ([birth year]) female who presents with cc: undetectable right arm blood pressure.  Patient was found on routine examination to have a weak right brachial artery pulse with undetectable blood pressure in that arm.  The patient denies any claudication equivalent symptoms in the right arm.  She is able to complete her ADL with that arm without difficulty.  The patient notes some vertigo that is dependent on her head position.  Pt denies any CVA or TIA sx.  Additionally, pt previous had Ao (12 mm x 40 mm) and R CIA stenting (9 mm x 28 mm) completed by Dr. Myra Gianotti in 06/04/10.  Patient denies any intermittent claudication or rest pain.  Patient does not some leg swelling and blue hue to feet.  Patient risk factors for atherosclerosis include: HTN, HLD  Past Medical History:  Diagnosis Date  . Allergy   . Arthritis   . Hyperlipidemia   . Hypertension   . Repeated falls    last fall 08-13-2016 per patient    Past Surgical History:  Procedure Laterality Date  . AORTA SURGERY  03/03/2010  . CYST REMOVAL TRUNK    . IR KYPHO EA ADDL LEVEL THORACIC OR LUMBAR  10/01/2016  . IR KYPHO THORACIC WITH BONE BIOPSY  10/01/2016   T9, T 12  . IR RADIOLOGIST EVAL & MGMT  09/23/2016  . IR RADIOLOGIST EVAL & MGMT  10/30/2016     Social History   Socioeconomic History  . Marital status: Married    Spouse name: Not on file  . Number of children: Not on file  . Years of education: Not on file  . Highest education level: Not on file  Social Needs  . Financial resource strain: Not on file  . Food insecurity - worry: Not on file  . Food insecurity - inability: Not on file  . Transportation needs - medical: Not on file  . Transportation needs - non-medical: Not on file  Occupational History  . Not on file  Tobacco Use  .  Smoking status: Never Smoker  . Smokeless tobacco: Never Used  Substance and Sexual Activity  . Alcohol use: Yes    Comment: socially  . Drug use: No  . Sexual activity: No  Other Topics Concern  . Not on file  Social History Narrative  . Not on file    Family History  Problem Relation Age of Onset  . Heart disease Mother   . Heart disease Father     Current Outpatient Medications  Medication Sig Dispense Refill  . acetaminophen (TYLENOL) 500 MG tablet Take 500 mg by mouth every 6 (six) hours as needed.    Marland Kitchen aspirin 81 MG tablet Take 81 mg by mouth daily.    . CRESTOR 20 MG tablet Take 20 mg by mouth daily.    Marland Kitchen gabapentin (NEURONTIN) 300 MG capsule TAKE ONE CAPSULE BY MOUTH ONCE DAILY IN THE EVENING 90 capsule 1  . lisinopril (PRINIVIL,ZESTRIL) 10 MG tablet Take 10 mg by mouth daily.    . meclizine (ANTIVERT) 25 MG tablet     . Multiple Vitamin (MULTIVITAMIN) capsule Take 1 capsule by mouth daily.    . calcium acetate (PHOSLO) 667 MG capsule Take 667 mg by mouth 3 (three) times daily with meals.    Marland Kitchen  oxyCODONE-acetaminophen (PERCOCET) 7.5-325 MG tablet Take 1 tablet by mouth every 6 (six) hours as needed for severe pain.     No current facility-administered medications for this visit.     No Known Allergies  REVIEW OF SYSTEMS (negative unless checked):   Cardiac:  []  Chest pain or chest pressure? []  Shortness of breath upon activity? []  Shortness of breath when lying flat? []  Irregular heart rhythm?  Vascular:  []  Pain in calf, thigh, or hip brought on by walking? []  Pain in feet at night that wakes you up from your sleep? []  Blood clot in your veins? []  Leg swelling?  Pulmonary:  []  Oxygen at home? []  Productive cough? []  Wheezing?  Neurologic:  []  Sudden weakness in arms or legs? [x]  Sudden numbness in arms or legs? []  Sudden onset of difficult speaking or slurred speech? []  Temporary loss of vision in one eye? [x]  Problems with  dizziness?  Gastrointestinal:  []  Blood in stool? []  Vomited blood?  Genitourinary:  []  Burning when urinating? []  Blood in urine?  Psychiatric:  []  Major depression  Hematologic:  []  Bleeding problems? []  Problems with blood clotting?  Dermatologic:  []  Rashes or ulcers?  Constitutional:  []  Fever or chills?  Ear/Nose/Throat:  []  Change in hearing? []  Nose bleeds? []  Sore throat?  Musculoskeletal:  []  Back pain? []  Joint pain? []  Muscle pain?   For VQI Use Only   PRE-ADM LIVING Home  AMB STATUS Ambulatory  CAD Sx None  PRIOR CHF None  STRESS TEST No    Physical Examination     Vitals:   05/13/17 1229 05/13/17 1233  BP: 132/68 111/78  Pulse: 78   Resp: 18   Temp: 97.6 F (36.4 C)   TempSrc: Oral   SpO2: 97%   Weight: 114 lb (51.7 kg)   Height: 5\' 2"  (1.575 m)    Body mass index is 20.85 kg/m.  General Alert, O x 3, WD, Elderly  Head [place]/AT,    Ear/Nose/ Throat Hearing grossly intact, nares without erythema or drainage, oropharynx without Erythema or Exudate, Mallampati score: 3,   Eyes PERRLA, EOMI,    Neck Supple, mid-line trachea,    Pulmonary Sym exp, good B air movt, CTA B  Cardiac RRR, Nl S1, S2, no Murmurs, No rubs, No S3,S4  Vascular Vessel Right Left  Radial Palpable Palpable  Brachial Palpable Palpable  Carotid Palpable, No Bruit Palpable, No Bruit  Aorta Not palpable N/A  Femoral Palpable Palpable  Popliteal Not palpable Not palpable  PT Palpable Palpable  DP Palpable Palpable    Gastro- intestinal soft, non-distended, non-tender to palpation, No guarding or rebound, no HSM, no masses, no CVAT B, No palpable prominent aortic pulse,    Musculo- skeletal M/S 5/5 throughout  , Extremities without ischemic changes  , No edema present, Varicosities present: B mod, No Lipodermatosclerosis present, B feet with cyanotic hue that resolves with elevation  Neurologic Cranial nerves 2-12 intact , Pain and light touch intact in  extremities , Motor exam as listed above  Psychiatric Judgement intact, Mood & affect appropriate for pt's clinical situation  Dermatologic See M/S exam for extremity exam, No rashes otherwise noted  Lymphatic  Palpable lymph nodes: None    Non-invasive Vascular Imaging     B Carotid Duplex (05/13/2017):  Right Carotid Findings:  PSV cm/s EDV cm/s Stenosis Describe Comments  CCA Prox 63 9     CCA Mid 50 12     CCA Distal 67 11 <50%  heterogenous and irregular    ICA Prox 85 21 1-39%  calcific and irregular  Velocities may underestimate degree of stenosis due to shadowing.   ICA Mid 74 14     ICA Distal 65 16     ECA 107 10  irregular and calcific      PSV cm/s EDV cms Describe Arm Pressure (mmHG)  Subclavian 289  Stenotic     Vertebral PSV cm/s 50 EDV cm/s Antegrade   Mid subclavian velocities are significantly decreased at 21 cm/s.   Right Stent(s):  PSV cm/s EDV cm/s Stenosis Waveform Comments  Prox to Stent       Proximal Stent       Mid Stent       Distal Stent       Distal to Stent          PSV cm/s EDV cm/s Stenosis Waveform Comments  Prox to Stent       Proximal Stent       Mid Stent       Distal Stent       Distal to Stent         Right Graft:  PSV cm/s Stenosis Waveform Comments  Inflow      Prox anastomosis      Proximal graft      Mid graft      Distal graft      Distal anastomosis      Outflow      Left Carotid Findings:  PSV cm/s EDV cm/s Stenosis Describe Comments  CCA Prox 49 13     CCA Mid 56 12     CCA Distal 58 14     ICA Prox 113 29 1-39%  calcific and irregular  Velocities may underestimate degree of stenosis due to shadowing obscuring Doppler signal  ICA Mid 83 18     ICA Distal 79 20     ECA 119 13       Subclavian PSV cm/s EDV cm/s Describe Arm Pressure (mmHG)   80  Multiphasic, WNL     Vertebral PSV cm/s 153 EDV cm/s  Antegrade       Final Interpretation: Right Carotid: Velocities in the right ICA are consistent with a  1-39% stenosis. Non-hemodynamically significant plaque <50% noted in the CCA. Unable to adequately evaluate stenosis in the ECA due to calcific plaque shadowing.    Left Carotid: Velocities in the left ICA are consistent with a 1-39% stenosis. Non-hemodynamically significant plaque noted in the CCA. Unable to adequately evaluate stenosis in the ECA due to calcific plaque shadowing.    Vertebrals: Bilateral vertebral arteries demonstrate antegrade flow.    Subclavians: Right subclavian artery was stenotic. Right subclavian artery flow was disturbed. Normal flow hemodynamics were seen in the left subclavian artery. Significantly decreased velociteis noted in the right mid subclavian artery.     Outside Studies/Documentation   5 pages of outside documents were reviewed including: outpatient PCP chart.   Medical Decision Making   [name] is a 84 y.o. female who presents with: asx R SCA stenosis, dizziness (possible BPPV), s/p Ao and R CIA stenting for aortoiliac stenoses, BLE CVI   In regards to her R SCA, pt is asx, so difficult to make the argument to proceed with R SCA stenting, given the limited benefit from stenting.  We discussed proceeding if her sx worsens or she develops ADL limiting claudication or evidence of tissue loss in R hand.  Chronic development  of subclavian artery stenosis leads to collateralization, so I doubt she will develop any tissue loss.     In regards to the cyanotic hue in her feet, she has palpable pedal pulses so this cyanosis is due to chronic venous insufficiency rather than peripheral artery insufficiency.   Patient isn't interested in further work-up.  We discussed the use of compression stockings for this.   In regards to her aortoiliac disease, pt was has been lost to follow up for surveillance of there previously placed stents..  Based on the patient's vascular studies and examination, I have offered the patient: BLE ABI and aortoiliac  duplex in 6 months.  I discussed in depth with the patient the nature of atherosclerosis, and emphasized the importance of maximal medical management including strict control of blood pressure, blood glucose, and lipid levels, obtaining regular exercise, antiplatelet agents, and cessation of smoking.   The patient is currently on a statin: Crestor.  The patient is currently on an anti-platelet: ASA.  The patient is aware that without maximal medical management the underlying atherosclerotic disease process will progress, limiting the benefit of any interventions.  Thank you for allowing us to participate in this patient's care.   Leonides SakeBrian Chen, MD, FACS [facility] Office: [phone] Pager: [phone]  05/13/2017, 12:30 PM

## 2017-05-20 ENCOUNTER — Other Ambulatory Visit: Payer: Self-pay

## 2017-05-20 DIAGNOSIS — I739 Peripheral vascular disease, unspecified: Secondary | ICD-10-CM

## 2017-05-20 DIAGNOSIS — I771 Stricture of artery: Secondary | ICD-10-CM

## 2017-08-07 ENCOUNTER — Other Ambulatory Visit: Payer: Self-pay

## 2017-08-07 ENCOUNTER — Emergency Department (HOSPITAL_BASED_OUTPATIENT_CLINIC_OR_DEPARTMENT_OTHER)
Admission: EM | Admit: 2017-08-07 | Discharge: 2017-08-07 | Disposition: A | Discharge status: Home / Self Care | Payer: Medicare Other | Attending: Emergency Medicine | Admitting: Emergency Medicine

## 2017-08-07 ENCOUNTER — Emergency Department (HOSPITAL_BASED_OUTPATIENT_CLINIC_OR_DEPARTMENT_OTHER): Payer: Medicare Other

## 2017-08-07 ENCOUNTER — Encounter (HOSPITAL_BASED_OUTPATIENT_CLINIC_OR_DEPARTMENT_OTHER): Payer: Self-pay

## 2017-08-07 DIAGNOSIS — T07XXXA Unspecified multiple injuries, initial encounter: Secondary | ICD-10-CM

## 2017-08-07 DIAGNOSIS — Y9389 Activity, other specified: Secondary | ICD-10-CM | POA: Insufficient documentation

## 2017-08-07 DIAGNOSIS — Y92009 Unspecified place in unspecified non-institutional (private) residence as the place of occurrence of the external cause: Secondary | ICD-10-CM | POA: Diagnosis not present

## 2017-08-07 DIAGNOSIS — Y999 Unspecified external cause status: Secondary | ICD-10-CM | POA: Insufficient documentation

## 2017-08-07 DIAGNOSIS — W01198A Fall on same level from slipping, tripping and stumbling with subsequent striking against other object, initial encounter: Secondary | ICD-10-CM | POA: Diagnosis not present

## 2017-08-07 DIAGNOSIS — S0990XA Unspecified injury of head, initial encounter: Secondary | ICD-10-CM | POA: Diagnosis present

## 2017-08-07 DIAGNOSIS — Z7982 Long term (current) use of aspirin: Secondary | ICD-10-CM | POA: Diagnosis not present

## 2017-08-07 DIAGNOSIS — S0003XA Contusion of scalp, initial encounter: Secondary | ICD-10-CM | POA: Insufficient documentation

## 2017-08-07 DIAGNOSIS — I10 Essential (primary) hypertension: Secondary | ICD-10-CM | POA: Diagnosis not present

## 2017-08-07 DIAGNOSIS — Z79899 Other long term (current) drug therapy: Secondary | ICD-10-CM | POA: Insufficient documentation

## 2017-08-07 DIAGNOSIS — S9001XA Contusion of right ankle, initial encounter: Secondary | ICD-10-CM | POA: Diagnosis not present

## 2017-08-07 DIAGNOSIS — W19XXXA Unspecified fall, initial encounter: Secondary | ICD-10-CM

## 2017-08-07 MED ORDER — ACETAMINOPHEN 500 MG PO TABS
500.0000 mg | ORAL_TABLET | Freq: Once | ORAL | Status: AC
  Administered 2017-08-07: 500 mg via ORAL
  Filled 2017-08-07: qty 1

## 2017-08-07 NOTE — ED Provider Notes (Signed)
[facility] EMERGENCY DEPARTMENT Provider Note   CSN: [id number] Arrival date & time: 08/07/17  1823     History   Chief Complaint Chief Complaint  Patient presents with  . Fall    HPI [name] is a 84 y.o. female.  She is a history of frequent falls.  She was at home with her husband trying to take off her shoes and lost her balance fell back struck the right side of her head on the carpet floor.  There is no LOC but the husband was concerned because she kept forgetting that she fell.  Currently now she is able to relate history of falling and remembers everything complaining of a moderate headache throbbing in nature.  She is also got some bruising and discomfort in her right elbow and her right hip.  There is no double vision blurry vision chest pain shortness of breath nausea vomiting numbness or weakness.  She is not on anticoagulation.  The history is provided by the patient, a relative and the spouse.  Fall  This is a new problem. The current episode started less than 1 hour ago. The problem has been gradually improving. Associated symptoms include headaches. Pertinent negatives include no chest pain, no abdominal pain and no shortness of breath. Nothing aggravates the symptoms. Nothing relieves the symptoms. She has tried nothing for the symptoms. The treatment provided no relief.    Past Medical History:  Diagnosis Date  . Allergy   . Arthritis   . Hyperlipidemia   . Hypertension   . Repeated falls    last fall 08-13-2016 per patient    Patient Active Problem List   Diagnosis Date Noted  . Subclavian artery disease (HCC) 05/13/2017  . Bursitis of right shoulder 03/13/2015  . Concussion with no loss of consciousness 01/15/2015  . Fall at home 01/09/2015  . PAC (premature atrial contraction) 01/09/2015  . Nonallopathic lesion of lumbosacral region 02/22/2014  . Nonallopathic lesion of thoracic region 02/22/2014  . Dizziness and giddiness 09/26/2013    . Spinal stenosis, lumbar region, with neurogenic claudication 05/18/2013  . SI (sacroiliac) joint dysfunction 05/18/2013  . Nonallopathic lesion of sacral region 05/18/2013    Past Surgical History:  Procedure Laterality Date  . AORTA SURGERY  03/03/2010  . CYST REMOVAL TRUNK    . IR KYPHO EA ADDL LEVEL THORACIC OR LUMBAR  10/01/2016  . IR KYPHO THORACIC WITH BONE BIOPSY  10/01/2016   T9, T 12  . IR RADIOLOGIST EVAL & MGMT  09/23/2016  . IR RADIOLOGIST EVAL & MGMT  10/30/2016     OB History   None      Home Medications    Prior to Admission medications   Medication Sig Start Date End Date Taking? Authorizing Provider  acetaminophen (TYLENOL) 500 MG tablet Take 500 mg by mouth every 6 (six) hours as needed.    [provider]  aspirin 81 MG tablet Take 81 mg by mouth daily.    [provider]  calcium acetate (PHOSLO) 667 MG capsule Take 667 mg by mouth 3 (three) times daily with meals.    [provider]  CRESTOR 20 MG tablet Take 20 mg by mouth daily. 06/14/12   [provider]  gabapentin (NEURONTIN) 300 MG capsule TAKE ONE CAPSULE BY MOUTH ONCE DAILY IN THE EVENING 10/08/15   Judi SaaSmith, Zachary M, DO  lisinopril (PRINIVIL,ZESTRIL) 10 MG tablet Take 10 mg by mouth daily.    [provider]  meclizine (ANTIVERT) 25 MG tablet  08/07/15   [provider]  Multiple Vitamin (MULTIVITAMIN) capsule Take 1 capsule by mouth daily.    [provider]  oxyCODONE-acetaminophen (PERCOCET) 7.5-325 MG tablet Take 1 tablet by mouth every 6 (six) hours as needed for severe pain.    [provider]    Family History Family History  Problem Relation Age of Onset  . Heart disease Mother   . Heart disease Father     Social History Social History   Tobacco Use  . Smoking status: Never Smoker  . Smokeless tobacco: Never Used  Substance Use Topics  . Alcohol use: Yes    Comment: socially  . Drug use: No     Allergies    Patient has no known allergies.   Review of Systems Review of Systems  Constitutional: Negative for chills and fever.  HENT: Negative for ear pain, rhinorrhea and sore throat.   Eyes: Negative for pain and visual disturbance.  Respiratory: Negative for cough and shortness of breath.   Cardiovascular: Negative for chest pain.  Gastrointestinal: Negative for abdominal pain, nausea and vomiting.  Genitourinary: Negative for dysuria and hematuria.  Musculoskeletal: Negative for back pain and neck pain.  Skin: Negative for rash.  Neurological: Positive for headaches. Negative for seizures.     Physical Exam Updated Vital Signs BP (!) 178/89 (BP Location: Right Arm)   Pulse 76   Temp 99.3 F (37.4 C) (Oral)   Resp 16   Ht 5\' 5"  (1.651 m)   Wt 49.9 kg (110 lb)   SpO2 95%   BMI 18.30 kg/m   Physical Exam  Constitutional: She appears well-developed and well-nourished. No distress.  HENT:  Head: Normocephalic.  Right Ear: External ear normal.  Left Ear: External ear normal.  Nose: Nose normal.  Mouth/Throat: Oropharynx is clear and moist.  She is got a small hematoma in her right parietal scalp.  There is no active bleeding.  Eyes: Pupils are equal, round, and reactive to light. Conjunctivae and EOM are normal.  Neck: Normal range of motion. Neck supple.  Cardiovascular: Normal rate, regular rhythm and normal heart sounds.  No murmur heard. Pulmonary/Chest: Effort normal and breath sounds normal. No respiratory distress.  Abdominal: Soft. She exhibits no mass. There is no tenderness.  Musculoskeletal: Normal range of motion. She exhibits no edema or deformity.  She has some bruising and some mild tenderness about her right ankle and some tenderness on the lateral aspect of her hip with no bruising.  Full range of motion of all joints without any gross deformities.  Neurological: She is alert. She has normal strength. No sensory deficit. GCS eye subscore is 4. GCS verbal  subscore is 5. GCS motor subscore is 6.  Skin: Skin is warm and dry.  Psychiatric: She has a normal mood and affect.  Nursing note and vitals reviewed.    ED Treatments / Results  Labs (all labs ordered are listed, but only abnormal results are displayed) Labs Reviewed - No data to display  EKG None  Radiology Dg Shoulder Right  Result Date: 08/07/2017 CLINICAL DATA:  Fall today.  Right shoulder soreness. EXAM: RIGHT SHOULDER - 2+ VIEW COMPARISON:  Radiographs 02/13/2015. FINDINGS: The bones appear mildly demineralized. There is no evidence of acute fracture or dislocation. There are glenohumeral degenerative changes with osteophytes. The subacromial space appears preserved. IMPRESSION: No evidence of acute fracture or dislocation. Glenohumeral degenerative changes. Electronically Signed   By: Hilarie Fredrickson.D.  On: 08/07/2017 20:20   Dg Elbow Complete Right  Result Date: 08/07/2017 CLINICAL DATA:  Fall today.  Right elbow bruising. EXAM: RIGHT ELBOW - COMPLETE 3+ VIEW COMPARISON:  None. FINDINGS: The mineralization and alignment are normal. There is no evidence of acute fracture or dislocation. There is no elbow joint effusion. There is mild spurring of the lateral humeral epicondyle within adjacent well corticated ossicle. Possible mild soft tissue swelling over the olecranon process. IMPRESSION: No evidence of acute fracture, dislocation or elbow joint effusion. Electronically Signed   By: Carey Bullocks M.D.   On: 08/07/2017 20:19   Ct Head Wo Contrast  Result Date: 08/07/2017 CLINICAL DATA:  Fall.  Hit the back of the head. EXAM: CT HEAD WITHOUT CONTRAST CT CERVICAL SPINE WITHOUT CONTRAST TECHNIQUE: Multidetector CT imaging of the head and cervical spine was performed following the standard protocol without intravenous contrast. Multiplanar CT image reconstructions of the cervical spine were also generated. COMPARISON:  CT head dated September 12, 2016. CT cervical spine dated August 26, 2013. FINDINGS: CT HEAD FINDINGS Brain: No evidence of acute infarction, hemorrhage, hydrocephalus, extra-axial collection or mass lesion/mass effect. Stable mild cerebral atrophy and extensive chronic microvascular ischemic changes. Unchanged empty sella. Vascular: Atherosclerotic vascular calcification of the carotid siphons. No hyperdense vessel. Skull: Normal. Negative for fracture or focal lesion. Sinuses/Orbits: No acute finding. Other: None. CT CERVICAL SPINE FINDINGS Alignment: No traumatic malalignment. Unchanged trace retrolisthesis at C3-C4 and C5-C6. Unchanged trace anterolisthesis at C7-T1. Skull base and vertebrae: No acute fracture. Chronic T2 compression fracture. No primary bone lesion or focal pathologic process. Soft tissues and spinal canal: No prevertebral fluid or swelling. No visible canal hematoma. Disc levels: Moderate to severe multilevel degenerative disc disease and uncovertebral hypertrophy. Severe left-sided facet arthropathy at C4-C5 and C7-T1. Upper chest: No acute abnormality. Other: Bilateral carotid artery calcific atherosclerosis. IMPRESSION: 1.  No acute intracranial abnormality. 2.  No acute cervical spine fracture. Electronically Signed   By: Obie Dredge M.D.   On: 08/07/2017 20:34   Ct Cervical Spine Wo Contrast  Result Date: 08/07/2017 CLINICAL DATA:  Fall.  Hit the back of the head. EXAM: CT HEAD WITHOUT CONTRAST CT CERVICAL SPINE WITHOUT CONTRAST TECHNIQUE: Multidetector CT imaging of the head and cervical spine was performed following the standard protocol without intravenous contrast. Multiplanar CT image reconstructions of the cervical spine were also generated. COMPARISON:  CT head dated September 12, 2016. CT cervical spine dated August 26, 2013. FINDINGS: CT HEAD FINDINGS Brain: No evidence of acute infarction, hemorrhage, hydrocephalus, extra-axial collection or mass lesion/mass effect. Stable mild cerebral atrophy and extensive chronic microvascular ischemic  changes. Unchanged empty sella. Vascular: Atherosclerotic vascular calcification of the carotid siphons. No hyperdense vessel. Skull: Normal. Negative for fracture or focal lesion. Sinuses/Orbits: No acute finding. Other: None. CT CERVICAL SPINE FINDINGS Alignment: No traumatic malalignment. Unchanged trace retrolisthesis at C3-C4 and C5-C6. Unchanged trace anterolisthesis at C7-T1. Skull base and vertebrae: No acute fracture. Chronic T2 compression fracture. No primary bone lesion or focal pathologic process. Soft tissues and spinal canal: No prevertebral fluid or swelling. No visible canal hematoma. Disc levels: Moderate to severe multilevel degenerative disc disease and uncovertebral hypertrophy. Severe left-sided facet arthropathy at C4-C5 and C7-T1. Upper chest: No acute abnormality. Other: Bilateral carotid artery calcific atherosclerosis. IMPRESSION: 1.  No acute intracranial abnormality. 2.  No acute cervical spine fracture. Electronically Signed   By: Obie Dredge M.D.   On: 08/07/2017 20:34   Dg Hip Unilat With Pelvis  2-3 Views Right  Result Date: 08/07/2017 CLINICAL DATA:  Right hip pain after fall. EXAM: DG HIP (WITH OR WITHOUT PELVIS) 2-3V RIGHT COMPARISON:  CT abdomen pelvis dated May 16, 2010. FINDINGS: No acute fracture or dislocation. Mild bilateral hip joint space narrowing. The pubic symphysis and sacroiliac joints are intact. Osteopenia. Right common iliac artery stent. Soft tissues are unremarkable. IMPRESSION: 1.  No acute osseous abnormality. Electronically Signed   By: Obie Dredge M.D.   On: 08/07/2017 20:20    Procedures Procedures (including critical care time)  Medications Ordered in ED Medications  acetaminophen (TYLENOL) tablet 500 mg (has no administration in time range)     Initial Impression / Assessment and Plan / ED Course  I have reviewed the triage vital signs and the nursing notes.  Pertinent labs & imaging results that were available during my care of  the patient were reviewed by me and considered in my medical decision making (see chart for details).  Clinical Course as of Aug 08 1208  Fri Aug 07, 2017  2048 Patient's imaging unremarkable.  Feel she can return with her family home and symptomatic treatment with ice and Tylenol.   [MB]    Clinical Course User Index [MB] Terrilee Files, MD    Final Clinical Impressions(s) / ED Diagnoses   Final diagnoses:  Injury of head, initial encounter  Multiple contusions  Fall in home, initial encounter    ED Discharge Orders    None       Terrilee Files, MD 08/08/17 1211

## 2017-08-07 NOTE — ED Notes (Signed)
ED Provider at bedside. 

## 2017-08-07 NOTE — Discharge Instructions (Signed)
You were evaluated in the emergency department which struck your head shoulder elbow and hip.  We did a CAT scan and some x-rays and did not show any obvious fractures or serious injuries.  You should use Tylenol for pain and ice to the affected areas.  Follow-up with your doctor and return if any worsening symptoms.

## 2017-08-07 NOTE — ED Triage Notes (Signed)
Pt states she fell approx 545pm-husband heard pt fall-went into room-pt was alert but did not recall events-pt with hx of multiple falls-pt c/o pain to back of head and right hip-presents to triage in w/c

## 2017-08-07 NOTE — ED Notes (Signed)
Patient transported to X-ray 

## 2017-10-07 ENCOUNTER — Other Ambulatory Visit: Payer: Self-pay | Admitting: Nurse Practitioner

## 2017-10-07 DIAGNOSIS — Z1231 Encounter for screening mammogram for malignant neoplasm of breast: Secondary | ICD-10-CM

## 2017-11-18 ENCOUNTER — Ambulatory Visit: Payer: Medicare Other | Admitting: Vascular Surgery

## 2017-11-18 ENCOUNTER — Ambulatory Visit (HOSPITAL_COMMUNITY)
Admission: RE | Admit: 2017-11-18 | Discharge: 2017-11-18 | Disposition: A | Discharge status: Home / Self Care | Payer: Medicare Other | Source: Ambulatory Visit | Attending: Vascular Surgery | Admitting: Vascular Surgery

## 2017-11-18 ENCOUNTER — Ambulatory Visit (INDEPENDENT_AMBULATORY_CARE_PROVIDER_SITE_OTHER)
Admission: RE | Admit: 2017-11-18 | Discharge: 2017-11-18 | Disposition: A | Discharge status: Home / Self Care | Payer: Medicare Other | Source: Ambulatory Visit | Attending: Vascular Surgery | Admitting: Vascular Surgery

## 2017-11-18 ENCOUNTER — Ambulatory Visit: Payer: Medicare Other

## 2017-11-18 DIAGNOSIS — R0989 Other specified symptoms and signs involving the circulatory and respiratory systems: Secondary | ICD-10-CM | POA: Diagnosis present

## 2017-11-18 DIAGNOSIS — I739 Peripheral vascular disease, unspecified: Secondary | ICD-10-CM

## 2017-11-18 DIAGNOSIS — I771 Stricture of artery: Secondary | ICD-10-CM | POA: Insufficient documentation

## 2017-11-18 DIAGNOSIS — E785 Hyperlipidemia, unspecified: Secondary | ICD-10-CM | POA: Diagnosis not present

## 2017-11-18 DIAGNOSIS — I10 Essential (primary) hypertension: Secondary | ICD-10-CM | POA: Diagnosis not present

## 2017-11-19 ENCOUNTER — Ambulatory Visit (INDEPENDENT_AMBULATORY_CARE_PROVIDER_SITE_OTHER): Payer: Medicare Other

## 2017-11-19 DIAGNOSIS — Z1231 Encounter for screening mammogram for malignant neoplasm of breast: Secondary | ICD-10-CM

## 2017-11-24 ENCOUNTER — Encounter: Payer: Self-pay | Admitting: Vascular Surgery

## 2017-11-24 ENCOUNTER — Other Ambulatory Visit: Payer: Self-pay

## 2017-11-24 ENCOUNTER — Ambulatory Visit (INDEPENDENT_AMBULATORY_CARE_PROVIDER_SITE_OTHER): Payer: Medicare Other | Admitting: Vascular Surgery

## 2017-11-24 VITALS — BP 135/75 | HR 75 | Temp 98.3°F | Resp 16 | Ht 65.0 in | Wt 112.7 lb

## 2017-11-24 DIAGNOSIS — I739 Peripheral vascular disease, unspecified: Secondary | ICD-10-CM | POA: Diagnosis not present

## 2017-11-24 NOTE — Progress Notes (Signed)
Patient name: MALEEYAH MCCAUGHEY MRN: [id number] DOB: 10-12-1931 Sex: female  REASON FOR VISIT: 49-month follow-up status post abdominal aortic stent/ right common iliac stent  HPI: COLA GANE is a 84 y.o. female that presents for interval six-month follow-up for ongoing surveillance of her abdominal aortic as well as right common iliac artery stent.  Patient reports no issues with her legs in the interval.  She does have some pain with her right hip particularly with ambulation that she thinks is related to her arthritis.  Her feet overall are doing well with no numbness tingling weakness or rest pain.  She is living in a retirement home with her husband and states she is still able to walk and exercise during the day.  Her previous procedure was done in April 2012 she had a 12 x 4 EV3 self-expanding stent in the aorta and then a right common iliac artery 9 x 28 Omnilink.  She is taking an aspirin and a statin.  She denies tobacco abuse.  Past Medical History:  Diagnosis Date  . Allergy   . Arthritis   . Hyperlipidemia   . Hypertension   . Repeated falls    last fall 08-13-2016 per patient    Past Surgical History:  Procedure Laterality Date  . AORTA SURGERY  03/03/2010  . CYST REMOVAL TRUNK    . IR KYPHO EA ADDL LEVEL THORACIC OR LUMBAR  10/01/2016  . IR KYPHO THORACIC WITH BONE BIOPSY  10/01/2016   T9, T 12  . IR RADIOLOGIST EVAL & MGMT  09/23/2016  . IR RADIOLOGIST EVAL & MGMT  10/30/2016    Family History  Problem Relation Age of Onset  . Heart disease Mother   . Heart disease Father     SOCIAL HISTORY: Social History   Tobacco Use  . Smoking status: Never Smoker  . Smokeless tobacco: Never Used  Substance Use Topics  . Alcohol use: Yes    Comment: socially    No Known Allergies  Current Outpatient Medications  Medication Sig Dispense Refill  . acetaminophen (TYLENOL) 500 MG tablet Take 500 mg by mouth every 6 (six) hours as needed.    Marland Kitchen aspirin 81 MG tablet Take  81 mg by mouth daily.    . calcium acetate (PHOSLO) 667 MG capsule Take 667 mg by mouth 3 (three) times daily with meals.    . CRESTOR 20 MG tablet Take 20 mg by mouth daily.    Marland Kitchen gabapentin (NEURONTIN) 300 MG capsule TAKE ONE CAPSULE BY MOUTH ONCE DAILY IN THE EVENING 90 capsule 1  . lisinopril (PRINIVIL,ZESTRIL) 10 MG tablet Take 10 mg by mouth daily.    . meclizine (ANTIVERT) 25 MG tablet     . Multiple Vitamin (MULTIVITAMIN) capsule Take 1 capsule by mouth daily.     No current facility-administered medications for this visit.     REVIEW OF SYSTEMS:  [X]  denotes positive finding, [ ]  denotes negative finding Cardiac  Comments:  Chest pain or chest pressure:    Shortness of breath upon exertion:    Short of breath when lying flat:    Irregular heart rhythm:        Vascular    Pain in calf, thigh, or hip brought on by ambulation:    Pain in feet at night that wakes you up from your sleep:     Blood clot in your veins:    Leg swelling:         Pulmonary  Oxygen at home:    Productive cough:     Wheezing:         Neurologic    Sudden weakness in arms or legs:     Sudden numbness in arms or legs:     Sudden onset of difficulty speaking or slurred speech:    Temporary loss of vision in one eye:     Problems with dizziness:         Gastrointestinal    Blood in stool:     Vomited blood:         Genitourinary    Burning when urinating:     Blood in urine:        Psychiatric    Major depression:         Hematologic    Bleeding problems:    Problems with blood clotting too easily:        Skin    Rashes or ulcers:        Constitutional    Fever or chills:      PHYSICAL EXAM: Vitals:   11/24/17 1034  BP: 135/75  Pulse: 75  Resp: 16  Temp: 98.3 F (36.8 C)  TempSrc: Oral  SpO2: 94%  Weight: 51.1 kg  Height: 5\' 5"  (1.651 m)    GENERAL: The patient is a well-nourished female, in no acute distress. The vital signs are documented above. CARDIAC: There is  a regular rate and rhythm.  VASCULAR:  2+ palpable left radial pulse, no palpable right radial pulse No tissue loss to the right hand Right hand warm 2+ femoral pulse palpable bilateral groins 2+ palpable dorsalis pedis and posterior tibial pulses bilateral lower extremity PULMONARY: No respiratory distress ABDOMEN: Soft and non-tender with normal pitched bowel sounds.  MUSCULOSKELETAL: There are no major deformities or cyanosis. NEUROLOGIC: No focal weakness or paresthesias are detected. SKIN: There are no ulcers or rashes noted. PSYCHIATRIC: The patient has a normal affect.  DATA:   I independently reviewed for noninvasive imaging that shows a widely patent aortic stent as well as a widely patent right common iliac stent.    Her ABIs are normal with triphasic waveforms.  Assessment/Plan:  Overall I think Ms. Etheleen MayhewFuquay is doing very well.  She has no significant symptoms in her legs consistent with claudication or rest pain.  Her aortic stent as well as her right common iliac stent look widely patent today with normal waveforms and no elevated velocities.  She has normal ABIs as well.  She has palpable posterior tibial and dorsalis pedis pulses on exam.  I offered her follow-up again in 6 months since she has had inconsistent follow-up in the past.  Assuming everything looks good on a repeat aortoiliac duplex/ABI's in 6 months we can probably just do surveillance every year.  In addition she does have decreased blood pressure in her right arm with a nonpalpable right radial pulse consistent with steal phenomenon as previously documented.  Fortunately she has no right arm symptoms and does not appear to be having any significant dizziness that would warrant intervention at this time.   Cephus Shellinghristopher J. Meshulem Onorato, MD [facility] Office: [phone] Pager: [phone]   Cephus Shellinghristopher J Haelee Bolen

## 2018-05-25 ENCOUNTER — Encounter (HOSPITAL_COMMUNITY): Payer: Medicare Other

## 2018-05-25 ENCOUNTER — Ambulatory Visit: Payer: Medicare Other | Admitting: Vascular Surgery

## 2018-06-07 ENCOUNTER — Encounter (INDEPENDENT_AMBULATORY_CARE_PROVIDER_SITE_OTHER): Payer: Self-pay

## 2018-07-29 ENCOUNTER — Encounter (HOSPITAL_BASED_OUTPATIENT_CLINIC_OR_DEPARTMENT_OTHER): Payer: Self-pay | Admitting: *Deleted

## 2018-07-29 ENCOUNTER — Emergency Department (HOSPITAL_BASED_OUTPATIENT_CLINIC_OR_DEPARTMENT_OTHER)
Admission: EM | Admit: 2018-07-29 | Discharge: 2018-07-29 | Disposition: A | Discharge status: Home / Self Care | Payer: Medicare Other | Attending: Emergency Medicine | Admitting: Emergency Medicine

## 2018-07-29 ENCOUNTER — Emergency Department (HOSPITAL_BASED_OUTPATIENT_CLINIC_OR_DEPARTMENT_OTHER): Payer: Medicare Other

## 2018-07-29 DIAGNOSIS — Z7982 Long term (current) use of aspirin: Secondary | ICD-10-CM | POA: Diagnosis not present

## 2018-07-29 DIAGNOSIS — S80212A Abrasion, left knee, initial encounter: Secondary | ICD-10-CM | POA: Diagnosis not present

## 2018-07-29 DIAGNOSIS — W1839XA Other fall on same level, initial encounter: Secondary | ICD-10-CM | POA: Diagnosis not present

## 2018-07-29 DIAGNOSIS — S098XXA Other specified injuries of head, initial encounter: Secondary | ICD-10-CM | POA: Insufficient documentation

## 2018-07-29 DIAGNOSIS — Y929 Unspecified place or not applicable: Secondary | ICD-10-CM | POA: Insufficient documentation

## 2018-07-29 DIAGNOSIS — Y9389 Activity, other specified: Secondary | ICD-10-CM | POA: Diagnosis not present

## 2018-07-29 DIAGNOSIS — I10 Essential (primary) hypertension: Secondary | ICD-10-CM | POA: Insufficient documentation

## 2018-07-29 DIAGNOSIS — Z79899 Other long term (current) drug therapy: Secondary | ICD-10-CM | POA: Diagnosis not present

## 2018-07-29 DIAGNOSIS — S6000XA Contusion of unspecified finger without damage to nail, initial encounter: Secondary | ICD-10-CM | POA: Diagnosis not present

## 2018-07-29 DIAGNOSIS — S40012A Contusion of left shoulder, initial encounter: Secondary | ICD-10-CM | POA: Diagnosis not present

## 2018-07-29 DIAGNOSIS — S01132A Puncture wound without foreign body of left eyelid and periocular area, initial encounter: Secondary | ICD-10-CM | POA: Insufficient documentation

## 2018-07-29 DIAGNOSIS — T148XXA Other injury of unspecified body region, initial encounter: Secondary | ICD-10-CM | POA: Insufficient documentation

## 2018-07-29 DIAGNOSIS — T07XXXA Unspecified multiple injuries, initial encounter: Secondary | ICD-10-CM

## 2018-07-29 DIAGNOSIS — Z23 Encounter for immunization: Secondary | ICD-10-CM | POA: Diagnosis not present

## 2018-07-29 DIAGNOSIS — S0990XA Unspecified injury of head, initial encounter: Secondary | ICD-10-CM

## 2018-07-29 DIAGNOSIS — Y999 Unspecified external cause status: Secondary | ICD-10-CM | POA: Insufficient documentation

## 2018-07-29 DIAGNOSIS — S59802A Other specified injuries of left elbow, initial encounter: Secondary | ICD-10-CM | POA: Diagnosis present

## 2018-07-29 DIAGNOSIS — W19XXXA Unspecified fall, initial encounter: Secondary | ICD-10-CM

## 2018-07-29 DIAGNOSIS — S51012A Laceration without foreign body of left elbow, initial encounter: Secondary | ICD-10-CM | POA: Insufficient documentation

## 2018-07-29 DIAGNOSIS — S70312A Abrasion, left thigh, initial encounter: Secondary | ICD-10-CM | POA: Insufficient documentation

## 2018-07-29 DIAGNOSIS — S60419A Abrasion of unspecified finger, initial encounter: Secondary | ICD-10-CM | POA: Diagnosis not present

## 2018-07-29 DIAGNOSIS — S0181XA Laceration without foreign body of other part of head, initial encounter: Secondary | ICD-10-CM

## 2018-07-29 MED ORDER — TETANUS-DIPHTH-ACELL PERTUSSIS 5-2.5-18.5 LF-MCG/0.5 IM SUSP
0.5000 mL | Freq: Once | INTRAMUSCULAR | Status: AC
Start: 2018-07-29 — End: 2018-07-29
  Administered 2018-07-29: 20:00:00 0.5 mL via INTRAMUSCULAR
  Filled 2018-07-29: qty 0.5

## 2018-07-29 MED ORDER — "THROMBI-PAD 3""X3"" EX PADS"
1.0000 | MEDICATED_PAD | Freq: Once | CUTANEOUS | Status: AC
  Administered 2018-07-29: 1 via TOPICAL
  Filled 2018-07-29: qty 1

## 2018-07-29 MED ORDER — LIDOCAINE HCL 1 % IJ SOLN
INTRAMUSCULAR | Status: AC
  Administered 2018-07-29: 20 mL
  Filled 2018-07-29: qty 20

## 2018-07-29 NOTE — ED Notes (Signed)
RN Earlene Plater spoke with Pt. Daughter when the daughter called.

## 2018-07-29 NOTE — ED Notes (Signed)
Patient transported to X-ray 

## 2018-07-29 NOTE — ED Notes (Signed)
Patient assisted to car; [facility] transported patient to residence.

## 2018-07-29 NOTE — ED Notes (Signed)
[employer] - they will be coming to pick up patient

## 2018-07-29 NOTE — Discharge Instructions (Signed)
Suture removal from the left elbow laceration in 14 days.  Keep the dressing in place for 2 days.  Keep the wound dry.  After that she can remove the dressing and apply antibiotic ointment and then redress.  Apply antibiotic ointment to the skin tears and to the knee abrasions.  Into the wound on your left eyebrow area.  CT of head and neck and x-rays of your shoulder without any acute injuries.

## 2018-07-29 NOTE — ED Triage Notes (Signed)
Pt. Has noted L shoulder bruising noted when placed in gown with full ROM and R middle finger injury and bruising at the tip and controlled bleeding.  Pt. Also has noted L elbow skin tear and L knee abrasion and L brow abrasion.  Controlled bleeding at all abrasions.

## 2018-07-29 NOTE — ED Provider Notes (Signed)
        LACERATION REPAIR Performed by: Jimmye Norman Authorized by: Vanetta Mulders Consent: Verbal consent obtained. Risks and benefits: risks, benefits and alternatives were discussed Consent given by: patient Patient identity confirmed: provided demographic data Prepped and Draped in normal sterile fashion Wound explored  Laceration Location: left elbow  Laceration Length: 3 cm  No Foreign Bodies seen or palpated  Anesthesia: local infiltration  Local anesthetic: lidocaine 1%   Anesthetic total: 2 ml  Irrigation method: syringe Amount of cleaning: standard  Skin closure: 4.0 prolene  Number of sutures:5   Technique: simple interrupted  Patient tolerance: Patient tolerated the procedure well with no immediate complications.   Felicie Morn, NP 07/29/18 2219    Vanetta Mulders, MD 07/29/18 [phone]

## 2018-07-29 NOTE — ED Provider Notes (Signed)
[facility] EMERGENCY DEPARTMENT Provider Note   CSN: [id number] Arrival date & time: 07/29/18  1827    History   Chief Complaint Chief Complaint  Patient presents with   Fall    HPI [name] is a 85 y.o. female.     Patient lives at [facility].  Patient was bending over to pick up her small dog and she lost her balance and fell forward onto the concrete.  Patient with a bleeding wound above her left eyebrow.  Contusion around the left eye area.  Open wound to the left elbow.  Pain to the left shoulder area.  Abrasion to left knee.  And an abrasion to the left femur area.  And also bruising to the right hand.  Patient's tetanus is not up-to-date.  Currently all bleeding is controlled.  Patient not on blood thinners.  Patient denies fever or any upper respiratory symptoms.  Patient denies any loss of consciousness with the fall.  Patient's past medical history significant for hyperlipidemia hypertension and repeated falls.     Past Medical History:  Diagnosis Date   Allergy    Arthritis    Hyperlipidemia    Hypertension    Repeated falls    last fall 08-13-2016 per patient    Patient Active Problem List   Diagnosis Date Noted   PVD (peripheral vascular disease) (HCC) 11/24/2017   Subclavian artery disease (HCC) 05/13/2017   Bursitis of right shoulder 03/13/2015   Concussion with no loss of consciousness 01/15/2015   Fall at home 01/09/2015   PAC (premature atrial contraction) 01/09/2015   Nonallopathic lesion of lumbosacral region 02/22/2014   Nonallopathic lesion of thoracic region 02/22/2014   Dizziness and giddiness 09/26/2013   Spinal stenosis, lumbar region, with neurogenic claudication 05/18/2013   SI (sacroiliac) joint dysfunction 05/18/2013   Nonallopathic lesion of sacral region 05/18/2013    Past Surgical History:  Procedure Laterality Date   AORTA SURGERY  03/03/2010   CYST REMOVAL TRUNK     IR KYPHO EA ADDL  LEVEL THORACIC OR LUMBAR  10/01/2016   IR KYPHO THORACIC WITH BONE BIOPSY  10/01/2016   T9, T 12   IR RADIOLOGIST EVAL & MGMT  09/23/2016   IR RADIOLOGIST EVAL & MGMT  10/30/2016     OB History   No obstetric history on file.      Home Medications    Prior to Admission medications   Medication Sig Start Date End Date Taking? Authorizing Provider  acetaminophen (TYLENOL) 500 MG tablet Take 500 mg by mouth every 6 (six) hours as needed.    [provider]  aspirin 81 MG tablet Take 81 mg by mouth daily.    [provider]  calcium acetate (PHOSLO) 667 MG capsule Take 667 mg by mouth 3 (three) times daily with meals.    [provider]  CRESTOR 20 MG tablet Take 20 mg by mouth daily. 06/14/12   [provider]  gabapentin (NEURONTIN) 300 MG capsule TAKE ONE CAPSULE BY MOUTH ONCE DAILY IN THE EVENING 10/08/15   Judi Saa, DO  lisinopril (PRINIVIL,ZESTRIL) 10 MG tablet Take 10 mg by mouth daily.    [provider]  meclizine (ANTIVERT) 25 MG tablet  08/07/15   [provider]  Multiple Vitamin (MULTIVITAMIN) capsule Take 1 capsule by mouth daily.    [provider]    Family History Family History  Problem Relation Age of Onset   Heart disease Mother  Heart disease Father     Social History Social History   Tobacco Use   Smoking status: Never Smoker   Smokeless tobacco: Never Used  Substance Use Topics   Alcohol use: Yes    Comment: socially   Drug use: No     Allergies   Patient has no known allergies.   Review of Systems Review of Systems  Constitutional: Negative for chills and fever.  HENT: Negative for congestion, rhinorrhea and sore throat.   Eyes: Negative for photophobia, pain and visual disturbance.  Respiratory: Negative for cough and shortness of breath.   Cardiovascular: Negative for chest pain and leg swelling.  Gastrointestinal: Negative for abdominal pain, diarrhea, nausea and  vomiting.  Genitourinary: Negative for dysuria.  Musculoskeletal: Negative for back pain and neck pain.  Skin: Positive for wound. Negative for rash.  Neurological: Negative for dizziness, light-headedness and headaches.  Hematological: Does not bruise/bleed easily.  Psychiatric/Behavioral: Negative for confusion.     Physical Exam Updated Vital Signs BP (!) 168/107 (BP Location: Right Arm)    Pulse (!) 107    Temp 99.5 F (37.5 C) (Oral)    Resp 16    Ht 1.651 m ( )    Wt 50.3 kg    SpO2 93%    BMI 18.47 kg/m   Physical Exam Vitals signs and nursing note reviewed.  Constitutional:      General: She is not in acute distress.    Appearance: Normal appearance. She is well-developed.  HENT:     Head: Normocephalic.     Comments: Patient with a puncture wound left above left eyebrow.  Also with bruising on the upper part of the left orbit.  No active bleeding. Eyes:     Extraocular Movements: Extraocular movements intact.     Conjunctiva/sclera: Conjunctivae normal.     Pupils: Pupils are equal, round, and reactive to light.     Comments: Bruising around the left orbit.  Mostly on the upper eyebrow area.  Extraocular muscles are intact.  No hyphemas.  Neck:     Musculoskeletal: Normal range of motion and neck supple. No neck rigidity.  Cardiovascular:     Rate and Rhythm: Normal rate and regular rhythm.     Heart sounds: No murmur.  Pulmonary:     Effort: Pulmonary effort is normal. No respiratory distress.     Breath sounds: Normal breath sounds.  Abdominal:     General: Bowel sounds are normal.     Palpations: Abdomen is soft.     Tenderness: There is no abdominal tenderness.  Musculoskeletal: Normal range of motion.        General: Signs of injury present.     Comments: Patient with a laceration to the elbow with some bone exposed.  Left elbow.  Bruising to the left shoulder area.  Distally radial pulses 2+.  Right hand with some abrasion to the fingertips.  And some  bruising to the fingers.  Otherwise good cap refill.  Good range of motion.  Radial pulse to the right wrist is 2+.  Left knee with abrasion linear abrasion to the left thigh area.  In addition the left elbow area has in the vicinity some superficial skin tears.  Skin:    General: Skin is warm and dry.     Capillary Refill: Capillary refill takes less than 2 seconds.  Neurological:     General: No focal deficit present.     Mental Status: She is alert and oriented to  person, place, and time.     Cranial Nerves: No cranial nerve deficit.     Sensory: No sensory deficit.     Motor: No weakness.      ED Treatments / Results  Labs (all labs ordered are listed, but only abnormal results are displayed) Labs Reviewed - No data to display  EKG None  Radiology Dg Elbow Complete Left  Result Date: 07/29/2018 CLINICAL DATA:  Fall, pain EXAM: LEFT ELBOW - COMPLETE 3+ VIEW COMPARISON:  None. FINDINGS: Slight prominence of the anterior fat pad consistent with small elbow effusion. No definitive fracture lucency or dislocation IMPRESSION: No definitive fracture lucency, however there is small elbow effusion. Occult fracture remains a possibility. Electronically Signed   By: Jasmine PangKim  Fujinaga M.D.   On: 07/29/2018 20:22   Ct Head Wo Contrast  Result Date: 07/29/2018 CLINICAL DATA:  Multiple trauma. EXAM: CT HEAD WITHOUT CONTRAST CT CERVICAL SPINE WITHOUT CONTRAST TECHNIQUE: Multidetector CT imaging of the head and cervical spine was performed following the standard protocol without intravenous contrast. Multiplanar CT image reconstructions of the cervical spine were also generated. COMPARISON:  CT scan 08/07/2017 FINDINGS: CT HEAD FINDINGS Brain: Stable age related cerebral atrophy, ventriculomegaly and periventricular white matter disease. No extra-axial fluid collections are identified. No CT findings for acute hemispheric infarction or intracranial hemorrhage. No mass lesions. The brainstem and  cerebellum are normal. Empty sella noted. Vascular: Stable vascular calcifications. No definite aneurysm or hyperdense vessels. Skull: No skull fracture or bone lesion. Sinuses/Orbits: The paranasal sinuses and mastoid air cells are clear. The globes are intact. Other: No scalp lesions or hematoma. CT CERVICAL SPINE FINDINGS Alignment: Normal overall alignment. Stable degenerative cervical spondylosis with multilevel degenerative subluxations. Skull base and vertebrae: No acute fractures are identified. Moderate degenerative changes at C1-2 with pannus formation. Soft tissues and spinal canal: The spinal canal is fairly generous. No canal compromise. No abnormal prevertebral soft tissue swelling. Disc levels: Multilevel disc disease and facet disease but no significant canal stenosis. Stable spurring changes and flattening of the anterior thecal sac at C5-6 and C6-7. Upper chest: No significant upper lung findings. Other: Advanced vascular calcifications including both carotid arteries. IMPRESSION: 1. Stable age related cerebral atrophy, ventriculomegaly and periventricular white matter disease. 2. No acute intracranial findings or skull fracture. 3. Stable advanced degenerative cervical spondylosis but no acute cervical spine fracture. Electronically Signed   By: Rudie MeyerP.  Gallerani M.D.   On: 07/29/2018 21:02   Ct Cervical Spine Wo Contrast  Result Date: 07/29/2018 CLINICAL DATA:  Multiple trauma. EXAM: CT HEAD WITHOUT CONTRAST CT CERVICAL SPINE WITHOUT CONTRAST TECHNIQUE: Multidetector CT imaging of the head and cervical spine was performed following the standard protocol without intravenous contrast. Multiplanar CT image reconstructions of the cervical spine were also generated. COMPARISON:  CT scan 08/07/2017 FINDINGS: CT HEAD FINDINGS Brain: Stable age related cerebral atrophy, ventriculomegaly and periventricular white matter disease. No extra-axial fluid collections are identified. No CT findings for acute  hemispheric infarction or intracranial hemorrhage. No mass lesions. The brainstem and cerebellum are normal. Empty sella noted. Vascular: Stable vascular calcifications. No definite aneurysm or hyperdense vessels. Skull: No skull fracture or bone lesion. Sinuses/Orbits: The paranasal sinuses and mastoid air cells are clear. The globes are intact. Other: No scalp lesions or hematoma. CT CERVICAL SPINE FINDINGS Alignment: Normal overall alignment. Stable degenerative cervical spondylosis with multilevel degenerative subluxations. Skull base and vertebrae: No acute fractures are identified. Moderate degenerative changes at C1-2 with pannus formation. Soft tissues and spinal canal:  The spinal canal is fairly generous. No canal compromise. No abnormal prevertebral soft tissue swelling. Disc levels: Multilevel disc disease and facet disease but no significant canal stenosis. Stable spurring changes and flattening of the anterior thecal sac at C5-6 and C6-7. Upper chest: No significant upper lung findings. Other: Advanced vascular calcifications including both carotid arteries. IMPRESSION: 1. Stable age related cerebral atrophy, ventriculomegaly and periventricular white matter disease. 2. No acute intracranial findings or skull fracture. 3. Stable advanced degenerative cervical spondylosis but no acute cervical spine fracture. Electronically Signed   By: Rudie Meyer M.D.   On: 07/29/2018 21:02   Dg Shoulder Left  Result Date: 07/29/2018 CLINICAL DATA:  Fall with left shoulder injury, pain EXAM: LEFT SHOULDER - 2+ VIEW COMPARISON:  None. FINDINGS: No fracture or malalignment. Moderate glenohumeral degenerative changes. Left lung apex is clear IMPRESSION: No acute osseous abnormality Electronically Signed   By: Jasmine Pang M.D.   On: 07/29/2018 20:21   Dg Knee Complete 4 Views Left  Result Date: 07/29/2018 CLINICAL DATA:  Fall with knee pain EXAM: LEFT KNEE - COMPLETE 4+ VIEW COMPARISON:  None. FINDINGS: No  fracture or malalignment. Minimal patellofemoral and mild medial joint space degenerative change. No significant effusion IMPRESSION: Mild degenerative change.  No acute osseous abnormality Electronically Signed   By: Jasmine Pang M.D.   On: 07/29/2018 20:23   Dg Hand Complete Right  Result Date: 07/29/2018 CLINICAL DATA:  Fall EXAM: RIGHT HAND - COMPLETE 3+ VIEW COMPARISON:  None. FINDINGS: No acute displaced fracture or malalignment. Moderate arthritis at the first [facility] joint. Arthritic changes at the DIP joints and first PIP joint. No radiopaque foreign body in the soft tissues IMPRESSION: No acute osseous abnormality Electronically Signed   By: Jasmine Pang M.D.   On: 07/29/2018 20:20   Dg Femur Min 2 Views Left  Result Date: 07/29/2018 CLINICAL DATA:  Fall with pain EXAM: LEFT FEMUR 2 VIEWS COMPARISON:  None. FINDINGS: There is no evidence of fracture or other focal bone lesions. Soft tissues are unremarkable. IMPRESSION: Negative. Electronically Signed   By: Jasmine Pang M.D.   On: 07/29/2018 20:25    Procedures Procedures (including critical care time)  Medications Ordered in ED Medications  Tdap (BOOSTRIX) injection 0.5 mL (0.5 mLs Intramuscular Given 07/29/18 2017)  lidocaine (XYLOCAINE) 1 % (with pres) injection (20 mLs  Given by Other 07/29/18 2148)  Thrombi-Pad 3"X3" pad 1 each (1 each Topical Given 07/29/18 2247)     Initial Impression / Assessment and Plan / ED Course  I have reviewed the triage vital signs and the nursing notes.  Pertinent labs & imaging results that were available during my care of the patient were reviewed by me and considered in my medical decision making (see chart for details).        Patient with a significant fall and face plant.  CT head CT neck without any acute findings.  X-rays of the left elbow without any bony injury left shoulder left knee and right hand without any significant injuries.  Patient underwent suture repair of the left elbow  laceration by the physician assistant.  Other wounds were dressed with antibiotic ointment.  We had some recurrent bleeding to the puncture wound to the left eyebrow area.  And this had some quick clot applied.  Patient stable for discharge home.  Patient's tetanus was updated.  Recommend suture removal in 14 days.  Patient will follow-up with [facility] clinic for this.  Final Clinical Impressions(s) /  ED Diagnoses   Final diagnoses:  Fall, initial encounter  Injury of head, initial encounter  Laceration of left elbow, initial encounter  Facial laceration, initial encounter  Abrasions of multiple sites  Multiple skin tears    ED Discharge Orders    None       Vanetta Mulders, MD 07/29/18 2254

## 2018-11-01 ENCOUNTER — Other Ambulatory Visit: Payer: Self-pay | Admitting: Nurse Practitioner

## 2018-11-01 ENCOUNTER — Encounter: Payer: Self-pay | Admitting: Physician Assistant

## 2018-11-01 DIAGNOSIS — Z0000 Encounter for general adult medical examination without abnormal findings: Secondary | ICD-10-CM

## 2018-11-01 DIAGNOSIS — Z1231 Encounter for screening mammogram for malignant neoplasm of breast: Secondary | ICD-10-CM

## 2018-11-11 ENCOUNTER — Other Ambulatory Visit (INDEPENDENT_AMBULATORY_CARE_PROVIDER_SITE_OTHER): Payer: Medicare Other

## 2018-11-11 ENCOUNTER — Ambulatory Visit (INDEPENDENT_AMBULATORY_CARE_PROVIDER_SITE_OTHER): Payer: Medicare Other | Admitting: Physician Assistant

## 2018-11-11 ENCOUNTER — Other Ambulatory Visit: Payer: Self-pay

## 2018-11-11 ENCOUNTER — Encounter: Payer: Self-pay | Admitting: Physician Assistant

## 2018-11-11 VITALS — BP 121/62 | Temp 97.4°F | Ht 65.0 in | Wt 107.6 lb

## 2018-11-11 DIAGNOSIS — R194 Change in bowel habit: Secondary | ICD-10-CM

## 2018-11-11 DIAGNOSIS — R110 Nausea: Secondary | ICD-10-CM

## 2018-11-11 DIAGNOSIS — R1013 Epigastric pain: Secondary | ICD-10-CM

## 2018-11-11 DIAGNOSIS — K219 Gastro-esophageal reflux disease without esophagitis: Secondary | ICD-10-CM

## 2018-11-11 LAB — CBC WITH DIFFERENTIAL/PLATELET
Basophils Absolute: 0 10*3/uL (ref 0.0–0.1)
Basophils Relative: 0.5 % (ref 0.0–3.0)
Eosinophils Absolute: 0.2 10*3/uL (ref 0.0–0.7)
Eosinophils Relative: 3.1 % (ref 0.0–5.0)
HCT: 40 % (ref 36.0–46.0)
Hemoglobin: 13.4 g/dL (ref 12.0–15.0)
Lymphocytes Relative: 23.8 % (ref 12.0–46.0)
Lymphs Abs: 1.6 10*3/uL (ref 0.7–4.0)
MCHC: 33.4 g/dL (ref 30.0–36.0)
MCV: 96.8 fl (ref 78.0–100.0)
Monocytes Absolute: 0.5 10*3/uL (ref 0.1–1.0)
Monocytes Relative: 7.8 % (ref 3.0–12.0)
Neutro Abs: 4.4 10*3/uL (ref 1.4–7.7)
Neutrophils Relative %: 64.8 % (ref 43.0–77.0)
Platelets: 191 10*3/uL (ref 150.0–400.0)
RBC: 4.13 Mil/uL (ref 3.87–5.11)
RDW: 14.5 % (ref 11.5–15.5)
WBC: 6.8 10*3/uL (ref 4.0–10.5)

## 2018-11-11 MED ORDER — PANTOPRAZOLE SODIUM 40 MG PO TBEC: 40.0000 mg | DELAYED_RELEASE_TABLET | Freq: Every day | ORAL | 2 refills | Status: DC

## 2018-11-11 NOTE — Progress Notes (Signed)
Reviewed

## 2018-11-11 NOTE — Progress Notes (Signed)
Chief Complaint: Change in bowel habits, nausea, epigastric pain and decrease in appetite  HPI:    [name] is an 85 y/o Caucasian female with a past medical history as listed below,  who was referred to me by Billie Ruddy I, NP for a complaint of change in bowel habits, nausea, epigastric pain and decrease in appetite    Today, the patient presents clinic accompanied by her daughter who does assist with her history.  Describes that over the past 2 weeks that she has had an increase in nausea which is almost constant and a decrease in her appetite.  This is accompanied by a crampy, generalized abdominal pain which feels like "uneasiness".  She has also noticed that she "fills up quickly".  Telling me that she can hardly eat but half of her food before she feels full.  Has been using Pepto-Bismol which seems to ease all of these sensations.  Along with this describes that she will have at least a couple bowel movements throughout the day, at first they are solid followed by a "gush of liquid".  Does describe one night of severe reflux.  As of last night, she was able to eat some solid food, prior to this just soft food and did fine with no abdominal pain, nausea or diarrhea.  This morning she has had both breakfast and lunch and seems to be somewhat better as well.  Denies NSAID use.    Denies fever, chills, blood in her stool or symptoms that awaken her from sleep.  Past Medical History:  Diagnosis Date  . Allergy   . Arthritis   . Hyperlipidemia   . Hypertension   . Repeated falls    last fall 08-13-2016 per patient    Past Surgical History:  Procedure Laterality Date  . AORTA SURGERY  03/03/2010  . CYST REMOVAL TRUNK    . IR KYPHO EA ADDL LEVEL THORACIC OR LUMBAR  10/01/2016  . IR KYPHO THORACIC WITH BONE BIOPSY  10/01/2016   T9, T 12  . IR RADIOLOGIST EVAL & MGMT  09/23/2016  . IR RADIOLOGIST EVAL & MGMT  10/30/2016    Current Outpatient Medications  Medication Sig Dispense Refill   . acetaminophen (TYLENOL) 500 MG tablet Take 500 mg by mouth every 6 (six) hours as needed.    Marland Kitchen aspirin 81 MG tablet Take 81 mg by mouth daily.    . calcium acetate (PHOSLO) 667 MG capsule Take 667 mg by mouth 3 (three) times daily with meals.    . CRESTOR 20 MG tablet Take 20 mg by mouth daily.    Marland Kitchen gabapentin (NEURONTIN) 300 MG capsule TAKE ONE CAPSULE BY MOUTH ONCE DAILY IN THE EVENING 90 capsule 1  . lisinopril (PRINIVIL,ZESTRIL) 10 MG tablet Take 10 mg by mouth daily.    . meclizine (ANTIVERT) 25 MG tablet     . Multiple Vitamin (MULTIVITAMIN) capsule Take 1 capsule by mouth daily.     No current facility-administered medications for this visit.     Allergies as of 11/11/2018  . (No Known Allergies)    Family History  Problem Relation Age of Onset  . Heart disease Mother   . Heart disease Father     Social History   Socioeconomic History  . Marital status: Married    Spouse name: Not on file  . Number of children: Not on file  . Years of education: Not on file  . Highest education level: Not on file  Occupational  History  . Not on file  Social Needs  . Financial resource strain: Not on file  . Food insecurity    Worry: Not on file    Inability: Not on file  . Transportation needs    Medical: Not on file    Non-medical: Not on file  Tobacco Use  . Smoking status: Never Smoker  . Smokeless tobacco: Never Used  Substance and Sexual Activity  . Alcohol use: Yes    Comment: socially  . Drug use: No  . Sexual activity: Not on file  Lifestyle  . Physical activity    Days per week: Not on file    Minutes per session: Not on file  . Stress: Not on file  Relationships  . Social Musician on phone: Not on file    Gets together: Not on file    Attends religious service: Not on file    Active member of club or organization: Not on file    Attends meetings of clubs or organizations: Not on file    Relationship status: Not on file  . Intimate partner  violence    Fear of current or ex partner: Not on file    Emotionally abused: Not on file    Physically abused: Not on file    Forced sexual activity: Not on file  Other Topics Concern  . Not on file  Social History Narrative  . Not on file    Review of Systems:    Constitutional: No weight loss, fever or chills Skin: No rash  Cardiovascular: No chest pain   Respiratory: No SOB  Gastrointestinal: See HPI and otherwise negative Genitourinary: No dysuria  Neurological: No headache, dizziness or syncope Musculoskeletal: No new muscle or joint pain Hematologic: No bleeding or bruising Psychiatric: No history of depression or anxiety   Physical Exam:  Vital signs: BP 121/62   Temp (!) 97.4 F (36.3 C)   Ht 5\' 5"  (1.651 m)   Wt 107 lb 9.6 oz (48.8 kg)   BMI 17.91 kg/m   Constitutional:   Pleasant Elderly Caucasian female appears to be in NAD, Well developed, Well nourished, alert and cooperative Head:  Normocephalic and atraumatic. Eyes:   PEERL, EOMI. No icterus. Conjunctiva pink. Ears:  Normal auditory acuity. Neck:  Supple Throat: Oral cavity and pharynx without inflammation, swelling or lesion.  Respiratory: Respirations even and unlabored. Lungs clear to auscultation bilaterally.   No wheezes, crackles, or rhonchi.  Cardiovascular: Normal S1, S2. No MRG. Regular rate and rhythm. No peripheral edema, cyanosis or pallor.  Gastrointestinal:  Soft, nondistended,mild epigastric ttp, No rebound or guarding. Normal bowel sounds. No appreciable masses or hepatomegaly. Rectal:  Not performed.  Msk:  Symmetrical without gross deformities. Without edema, no deformity or joint abnormality.  Neurologic:  Alert and  oriented x4;  grossly normal neurologically.  Skin:   Dry and intact without significant lesions or rashes. Psychiatric: Demonstrates good judgement and reason without abnormal affect or behaviors.  No recent labs or imaging.  Assessment: 1.  Epigastric pain: Tender at  time of exam today, also with nausea and decrease in appetite with early satiety; suspect gastritis 2.  Nausea: With above 3.  Decrease in appetite: with above 4.  Change in bowel habits: some loose stool mixed with solid, suspect this is also from above  Plan: 1.  Prescribed Pantoprazole 40 mg daily, 30-60 minutes before breakfast #30 with 2 refills. 2.  Discussed with patient that due to her  history of osteopenia would not recommend that she stay on this medication long-term, so we will try to decrease/stop it at her next visit. 3.  Patient to call our clinic if no better in the next 2 weeks. 4.  Ordered CBC and CMP 5.  Patient to follow with me in 3 to 4 weeks.  She was assigned to Dr. Marina GoodellPerry today.  Donna MeekerJennifer Shakoya Gilmore, PA-C [facility] 11/11/2018, 1:22 PM  Cc: Vernon Preyurbyfill, Holly I, NP

## 2018-11-11 NOTE — Patient Instructions (Signed)
We have sent the following medications to your pharmacy for you to pick up at your convenience:  Pantoprazole 40 mg 1 tablet 30-60 minutes before breakfast  Your provider has requested that you go to the basement level for lab work before leaving today. Press "B" on the elevator. The lab is located at the first door on the left as you exit the elevator.  Thank you for choosing me and [facility]

## 2018-11-15 ENCOUNTER — Telehealth: Payer: Self-pay | Admitting: Physician Assistant

## 2018-11-15 NOTE — Telephone Encounter (Signed)
Patient reports that she feels a little better as far as the abdominal pain and appetite, but she reports she does not feel normal yet.  She is advised that she has only been on the pantoprazole for 4 days now and advised to give it another week and call back if no improvement.  She also reports that her stools are soft.  She will try adding fiber to her diet and to let us know if she has no improvement in a week or two.

## 2018-12-01 ENCOUNTER — Other Ambulatory Visit: Payer: Self-pay

## 2018-12-01 ENCOUNTER — Ambulatory Visit (INDEPENDENT_AMBULATORY_CARE_PROVIDER_SITE_OTHER): Payer: Medicare Other

## 2018-12-01 DIAGNOSIS — Z1231 Encounter for screening mammogram for malignant neoplasm of breast: Secondary | ICD-10-CM | POA: Diagnosis not present

## 2018-12-02 ENCOUNTER — Encounter: Payer: Self-pay | Admitting: Physician Assistant

## 2018-12-02 ENCOUNTER — Ambulatory Visit (INDEPENDENT_AMBULATORY_CARE_PROVIDER_SITE_OTHER): Payer: Medicare Other | Admitting: Physician Assistant

## 2018-12-02 VITALS — BP 100/60 | HR 76 | Temp 98.4°F | Ht 64.0 in | Wt 103.0 lb

## 2018-12-02 DIAGNOSIS — R1013 Epigastric pain: Secondary | ICD-10-CM

## 2018-12-02 DIAGNOSIS — R194 Change in bowel habit: Secondary | ICD-10-CM | POA: Diagnosis not present

## 2018-12-02 DIAGNOSIS — R110 Nausea: Secondary | ICD-10-CM

## 2018-12-02 MED ORDER — PANTOPRAZOLE SODIUM 40 MG PO TBEC: 40.0000 mg | DELAYED_RELEASE_TABLET | Freq: Every day | ORAL | 2 refills | Status: AC

## 2018-12-02 NOTE — Patient Instructions (Signed)
If you are age 83 or older, your body mass index should be between 23-30. Your Body mass index is 17.68 kg/m. If this is out of the aforementioned range listed, please consider follow up with your Primary Care Provider.  If you are age 60 or younger, your body mass index should be between 19-25. Your Body mass index is 17.68 kg/m. If this is out of the aformentioned range listed, please consider follow up with your Primary Care Provider.   Start Citrucel fiber supplement daily.   Follow up in 3 months. [phone]  It was a pleasure to see you today!  J.Lemmon, PA-C

## 2018-12-02 NOTE — Progress Notes (Signed)
Chief Complaint: Follow-up epigastric pain and change in bowel habits  HPI:    [name] is an 85 year old female with a past medical history as listed below, assigned to Dr. Marina Goodell at last visit, who returns to clinic today for follow-up of epigastric pain.    11/11/2018 patient seen in clinic by me for a change in bowel habits, nausea, epigastric pain and a decrease in appetite.  She presented with her daughter and described over the past 2 weeks she had an increase in nausea which was almost constant and crampy generalized abdominal pain which felt like "uneasiness".  Also describes early satiety.  At that time patient was tender in epigastrium on exam.  She was prescribed Pantoprazole 40 mg daily before breakfast.  CBC was normal that day.  (CMP was supposed to be ordered but it looks like it was not done).    11/15/2018 patient called back to our office and explained that she felt a little better as far as the abdominal pain and appetite but she did not feel normal yet.  She was also told to try adding fiber to her diet to see if this helps with soft stools.    Today, the patient returns to clinic accompanied by her daughter and explains that overall she is much better.  She no longer complains of epigastric pain, nausea or early satiety while on the Pantoprazole 40 mg daily.  She does continue to tell me that she might have diarrhea 1 episode 1 day a week.  This still "bothers me".  Patient did not start the fiber as instructed over the phone because she thought this would contribute to her problem.  Also continues to complain of being gassy.    Denies fever, chills, weight loss, heartburn, reflux or symptoms that awaken her from sleep.  Past Medical History:  Diagnosis Date  . Allergy   . Arthritis   . Hyperlipidemia   . Hypertension   . Repeated falls    last fall 08-13-2016 per patient    Past Surgical History:  Procedure Laterality Date  . AORTA SURGERY  03/03/2010  . CYST REMOVAL  TRUNK    . IR KYPHO EA ADDL LEVEL THORACIC OR LUMBAR  10/01/2016  . IR KYPHO THORACIC WITH BONE BIOPSY  10/01/2016   T9, T 12  . IR RADIOLOGIST EVAL & MGMT  09/23/2016  . IR RADIOLOGIST EVAL & MGMT  10/30/2016    Current Outpatient Medications  Medication Sig Dispense Refill  . acetaminophen (TYLENOL) 500 MG tablet Take 500 mg by mouth every 6 (six) hours as needed.    Marland Kitchen aspirin 81 MG tablet Take 81 mg by mouth daily.    . calcium acetate (PHOSLO) 667 MG capsule Take 667 mg by mouth 3 (three) times daily with meals.    . CRESTOR 20 MG tablet Take 20 mg by mouth daily.    Marland Kitchen gabapentin (NEURONTIN) 300 MG capsule TAKE ONE CAPSULE BY MOUTH ONCE DAILY IN THE EVENING 90 capsule 1  . hydrochlorothiazide (MICROZIDE) 12.5 MG capsule Take 12.5 mg by mouth every other day.    . lisinopril (PRINIVIL,ZESTRIL) 10 MG tablet Take 10 mg by mouth daily.    . Multiple Vitamin (MULTIVITAMIN) capsule Take 1 capsule by mouth daily.    Marland Kitchen oxybutynin (DITROPAN) 5 MG tablet Take 5 mg by mouth once.    . pantoprazole (PROTONIX) 40 MG tablet Take 1 tablet (40 mg total) by mouth daily. Take 30-60 minutes before meals 30 tablet  2   No current facility-administered medications for this visit.     Allergies as of 12/02/2018  . (No Known Allergies)    Family History  Problem Relation Age of Onset  . Heart disease Mother   . Heart disease Father     Social History   Socioeconomic History  . Marital status: Married    Spouse name: Not on file  . Number of children: Not on file  . Years of education: Not on file  . Highest education level: Not on file  Occupational History  . Not on file  Social Needs  . Financial resource strain: Not on file  . Food insecurity    Worry: Not on file    Inability: Not on file  . Transportation needs    Medical: Not on file    Non-medical: Not on file  Tobacco Use  . Smoking status: Never Smoker  . Smokeless tobacco: Never Used  Substance and Sexual Activity  . Alcohol  use: Yes    Comment: socially  . Drug use: No  . Sexual activity: Not on file  Lifestyle  . Physical activity    Days per week: Not on file    Minutes per session: Not on file  . Stress: Not on file  Relationships  . Social Herbalist on phone: Not on file    Gets together: Not on file    Attends religious service: Not on file    Active member of club or organization: Not on file    Attends meetings of clubs or organizations: Not on file    Relationship status: Not on file  . Intimate partner violence    Fear of current or ex partner: Not on file    Emotionally abused: Not on file    Physically abused: Not on file    Forced sexual activity: Not on file  Other Topics Concern  . Not on file  Social History Narrative  . Not on file    Review of Systems:    Constitutional: No weight loss, fever or chills Cardiovascular: No chest pain  Respiratory: No SOB  Gastrointestinal: See HPI and otherwise negative   Physical Exam:  Vital signs: BP 100/60   Pulse 76   Temp 98.4 F (36.9 C)   Ht 5\' 4"  (1.626 m)   Wt 103 lb (46.7 kg)   BMI 17.68 kg/m   Constitutional:   Pleasant Elderly Caucasian female appears to be in NAD, Well developed, Well nourished, alert and cooperative Respiratory: Respirations even and unlabored. Lungs clear to auscultation bilaterally.   No wheezes, crackles, or rhonchi.  Cardiovascular: Normal S1, S2. No MRG. Regular rate and rhythm. No peripheral edema, cyanosis or pallor.  Gastrointestinal:  Soft, nondistended, nontender. No rebound or guarding. Normal bowel sounds. No appreciable masses or hepatomegaly. Rectal:  Not performed.  Psychiatric: Demonstrates good judgement and reason without abnormal affect or behaviors.  No recent labs or imaging.  Assessment: 1.  Epigastric pain: Resolved with pantoprazole 2.  Nausea: Resolved with pantoprazole 3.  Decrease in appetite: Resolved with pantoprazole 4.  Change in bowel habits: Continues with  one episode of loose stool 1 day a week  Plan: 1.  Continue Pantoprazole 40 mg daily for the next 3 months.  Refilled prescription for 90 days #90 with 1 refill. 2.  At time of follow-up we will try to decrease this medication/stop it. 3.  Would recommend the patient start fiber.  Instructed her to  start Citrucel as this is less gas causing.  Discussed fiber requirements of 25-35 g/day through her diet and use of a supplement.  She should just start by trying 1 dose a day in a 6 to 8 ounce glass of water. 4.  Patient to follow in clinic with me in 3 months.  She can call if she has problems sooner.  Hyacinth MeekerJennifer Elvia Aydin, PA-C [facility] 12/02/2018, 1:21 PM  Cc: Vernon Preyurbyfill, Holly I, NP

## 2018-12-02 NOTE — Progress Notes (Signed)
Assessment and plans reviewed  

## 2018-12-09 ENCOUNTER — Other Ambulatory Visit: Payer: Self-pay

## 2018-12-09 ENCOUNTER — Telehealth: Payer: Self-pay

## 2018-12-09 DIAGNOSIS — R1310 Dysphagia, unspecified: Secondary | ICD-10-CM

## 2018-12-09 NOTE — Telephone Encounter (Signed)
-----   Message from Levin Erp, [place] sent at 12/08/2018  2:48 PM EDT ----- Regarding: esophagram Thanks for the update. Will go ahead and order barium esophagram. Thanks-JLL  [name]. Please contact patient and ask about dysphagia. Then order barium esophagram with tablet. Thanks-JLL ----- Message ----- From: Jerene Bears, MD Sent: 12/08/2018   2:45 PM EDT To: Levin Erp, PA  Hey, I saw this patient's daughter today in clinic You saw her mother, the patient above, recently.   She did not mention this but she is having dysphagia to solids. I told my patient you could help with the dysphagia.  Not sure if you will want to see [name] again to discuss or order a barium esophagram 1st. Thanks JMP

## 2018-12-09 NOTE — Telephone Encounter (Signed)
Called patient to asked about her dysphagia, she said the only thing she has trouble with is bread. Scheduled for Barium Esophagram with tablet on 12/21/18 at [facility], patient to arrive at 10:15am and be NPO 3 hours before.

## 2018-12-17 ENCOUNTER — Emergency Department (HOSPITAL_COMMUNITY)
Admission: EM | Admit: 2018-12-17 | Discharge: 2018-12-17 | Disposition: A | Discharge status: Home / Self Care | Payer: Medicare Other | Attending: Emergency Medicine | Admitting: Emergency Medicine

## 2018-12-17 ENCOUNTER — Emergency Department (HOSPITAL_COMMUNITY): Payer: Medicare Other

## 2018-12-17 ENCOUNTER — Encounter (HOSPITAL_COMMUNITY): Payer: Self-pay | Admitting: Emergency Medicine

## 2018-12-17 ENCOUNTER — Other Ambulatory Visit: Payer: Self-pay

## 2018-12-17 DIAGNOSIS — R030 Elevated blood-pressure reading, without diagnosis of hypertension: Secondary | ICD-10-CM

## 2018-12-17 DIAGNOSIS — Z79899 Other long term (current) drug therapy: Secondary | ICD-10-CM | POA: Diagnosis not present

## 2018-12-17 DIAGNOSIS — Y9389 Activity, other specified: Secondary | ICD-10-CM | POA: Diagnosis not present

## 2018-12-17 DIAGNOSIS — Z7982 Long term (current) use of aspirin: Secondary | ICD-10-CM | POA: Diagnosis not present

## 2018-12-17 DIAGNOSIS — S0990XA Unspecified injury of head, initial encounter: Secondary | ICD-10-CM | POA: Diagnosis present

## 2018-12-17 DIAGNOSIS — W01198A Fall on same level from slipping, tripping and stumbling with subsequent striking against other object, initial encounter: Secondary | ICD-10-CM | POA: Insufficient documentation

## 2018-12-17 DIAGNOSIS — Y999 Unspecified external cause status: Secondary | ICD-10-CM | POA: Diagnosis not present

## 2018-12-17 DIAGNOSIS — W19XXXA Unspecified fall, initial encounter: Secondary | ICD-10-CM

## 2018-12-17 DIAGNOSIS — Y92128 Other place in nursing home as the place of occurrence of the external cause: Secondary | ICD-10-CM | POA: Insufficient documentation

## 2018-12-17 DIAGNOSIS — I10 Essential (primary) hypertension: Secondary | ICD-10-CM | POA: Diagnosis not present

## 2018-12-17 LAB — CBC WITH DIFFERENTIAL/PLATELET
Abs Immature Granulocytes: 0.02 10*3/uL (ref 0.00–0.07)
Basophils Absolute: 0.1 10*3/uL (ref 0.0–0.1)
Basophils Relative: 1 %
Eosinophils Absolute: 0.3 10*3/uL (ref 0.0–0.5)
Eosinophils Relative: 4 %
HCT: 42.1 % (ref 36.0–46.0)
Hemoglobin: 13.6 g/dL (ref 12.0–15.0)
Immature Granulocytes: 0 %
Lymphocytes Relative: 26 %
Lymphs Abs: 1.9 10*3/uL (ref 0.7–4.0)
MCH: 31.9 pg (ref 26.0–34.0)
MCHC: 32.3 g/dL (ref 30.0–36.0)
MCV: 98.8 fL (ref 80.0–100.0)
Monocytes Absolute: 0.5 10*3/uL (ref 0.1–1.0)
Monocytes Relative: 7 %
Neutro Abs: 4.5 10*3/uL (ref 1.7–7.7)
Neutrophils Relative %: 62 %
Platelets: 181 10*3/uL (ref 150–400)
RBC: 4.26 MIL/uL (ref 3.87–5.11)
RDW: 13.6 % (ref 11.5–15.5)
WBC: 7.3 10*3/uL (ref 4.0–10.5)
nRBC: 0 % (ref 0.0–0.2)

## 2018-12-17 LAB — COMPREHENSIVE METABOLIC PANEL
ALT: 21 U/L (ref 0–44)
AST: 29 U/L (ref 15–41)
Albumin: 4.6 g/dL (ref 3.5–5.0)
Alkaline Phosphatase: 38 U/L (ref 38–126)
Anion gap: 13 (ref 5–15)
BUN: 20 mg/dL (ref 8–23)
CO2: 30 mmol/L (ref 22–32)
Calcium: 10.3 mg/dL (ref 8.9–10.3)
Chloride: 94 mmol/L — ABNORMAL LOW (ref 98–111)
Creatinine, Ser: 1.13 mg/dL — ABNORMAL HIGH (ref 0.44–1.00)
GFR calc Af Amer: 51 mL/min — ABNORMAL LOW (ref >60)
GFR calc non Af Amer: 44 mL/min — ABNORMAL LOW (ref >60)
Glucose, Bld: 114 mg/dL — ABNORMAL HIGH (ref 70–99)
Potassium: 3 mmol/L — ABNORMAL LOW (ref 3.5–5.1)
Sodium: 137 mmol/L (ref 135–145)
Total Bilirubin: 0.5 mg/dL (ref 0.3–1.2)
Total Protein: 7.2 g/dL (ref 6.5–8.1)

## 2018-12-17 LAB — URINALYSIS, ROUTINE W REFLEX MICROSCOPIC
Bilirubin Urine: NEGATIVE
Glucose, UA: NEGATIVE mg/dL
Hgb urine dipstick: NEGATIVE
Ketones, ur: NEGATIVE mg/dL
Leukocytes,Ua: NEGATIVE
Nitrite: NEGATIVE
Protein, ur: NEGATIVE mg/dL
Specific Gravity, Urine: 1.01 (ref 1.005–1.030)
pH: 7 (ref 5.0–8.0)

## 2018-12-17 MED ORDER — CLONIDINE HCL 0.1 MG PO TABS
0.1000 mg | ORAL_TABLET | Freq: Once | ORAL | Status: AC
  Administered 2018-12-17: 0.1 mg via ORAL
  Filled 2018-12-17: qty 1

## 2018-12-17 MED ORDER — ACETAMINOPHEN 325 MG PO TABS
650.0000 mg | ORAL_TABLET | ORAL | Status: DC | PRN
  Administered 2018-12-17: 650 mg via ORAL
  Filled 2018-12-17: qty 2

## 2018-12-17 NOTE — ED Triage Notes (Addendum)
Per EMS- Pt here from independent living at [facility] for eval of a mechanical fall. States she lost her balance bringing in a flower and hit hte back of her head.  Not on blood thinners. EMS reports she was disoriented at first. Pt is alert and oriented presently. Pt endorses a headache and now reports her feet are both tingling.

## 2018-12-17 NOTE — ED Notes (Signed)
This note also relates to the following rows which could not be included: BP - Cannot attach notes to unvalidated device data MAP (mmHg) - Cannot attach notes to unvalidated device data    

## 2018-12-17 NOTE — ED Notes (Signed)
c-collar removed per verbal order from Dr. Lita Mains... pt denies neck pain and is resting comfortably after c-collar removed... daughter at bedside.

## 2018-12-17 NOTE — ED Provider Notes (Signed)
[facility]   CSN: [id number] Arrival date & time: 12/17/18  1944     History   Chief Complaint Chief Complaint  Patient presents with   Fall    HPI [name] is a 85 y.o. female.     HPI Patient with history of frequent falls.  Coming from independent living facility by EMS after losing her balance and falling backwards.  Struck her head on the sidewalk.  No loss of consciousness.  Mild disorientation after fall initially.  Complains of mild posterior headache.  Denies neck pain.  No focal weakness.  She does have some tingling in both of her feet after her bedroom shoes were removed.  States she was at her baseline status prior to the fall.  No preceding dizziness or lightheadedness.  No chest pain or shortness of breath.  Patient does acknowledge urinary frequency.  No fever or chills. Past Medical History:  Diagnosis Date   Allergy    Arthritis    Hyperlipidemia    Hypertension    Repeated falls    last fall 08-13-2016 per patient    Patient Active Problem List   Diagnosis Date Noted   PVD (peripheral vascular disease) (HCC) 11/24/2017   Subclavian artery disease (HCC) 05/13/2017   Bursitis of right shoulder 03/13/2015   Concussion with no loss of consciousness 01/15/2015   Fall at home 01/09/2015   PAC (premature atrial contraction) 01/09/2015   Nonallopathic lesion of lumbosacral region 02/22/2014   Nonallopathic lesion of thoracic region 02/22/2014   Dizziness and giddiness 09/26/2013   Spinal stenosis, lumbar region, with neurogenic claudication 05/18/2013   SI (sacroiliac) joint dysfunction 05/18/2013   Nonallopathic lesion of sacral region 05/18/2013    Past Surgical History:  Procedure Laterality Date   AORTA SURGERY  03/03/2010   CYST REMOVAL TRUNK     IR KYPHO EA ADDL LEVEL THORACIC OR LUMBAR  10/01/2016   IR KYPHO THORACIC WITH BONE BIOPSY  10/01/2016   T9, T 12   IR  RADIOLOGIST EVAL & MGMT  09/23/2016   IR RADIOLOGIST EVAL & MGMT  10/30/2016     OB History   No obstetric history on file.      Home Medications    Prior to Admission medications   Medication Sig Start Date End Date Taking? Authorizing Provider  acetaminophen (TYLENOL) 500 MG tablet Take 1,000 mg by mouth 2 (two) times daily.    Yes [provider]  Ascorbic Acid (VITAMIN C PO) Take 1 tablet by mouth every morning.   Yes [provider]  aspirin EC 81 MG tablet Take 81 mg by mouth every morning.    Yes [provider]  Calcium Carbonate-Vitamin D (CALCIUM 600+D PO) Take 1 tablet by mouth 2 (two) times daily.   Yes [provider]  Cholecalciferol (VITAMIN D3 PO) Take 1 tablet by mouth every morning.   Yes [provider]  hydrochlorothiazide (MICROZIDE) 12.5 MG capsule Take 12.5 mg by mouth every Monday, Wednesday, and Friday.    Yes [provider]  lisinopril (PRINIVIL,ZESTRIL) 10 MG tablet Take 10 mg by mouth daily.   Yes [provider]  MAGNESIUM PO Take 1 tablet by mouth every morning.   Yes [provider]  Multiple Vitamin (MULTIVITAMIN WITH MINERALS) TABS tablet Take 1 tablet by mouth every morning.   Yes [provider]  oxybutynin (DITROPAN-XL) 5 MG 24 hr tablet Take 5 mg by mouth at bedtime. 11/29/18  Yes [provider]  pantoprazole (PROTONIX) 40 MG tablet Take 1 tablet (40 mg total) by mouth daily. Take 30-60 minutes before meals Patient taking differently: Take 40 mg by mouth daily before supper.  12/02/18  Yes Lemmon, Violet Baldy, PA  Polyethyl Glycol-Propyl Glycol (SYSTANE OP) Place 1 drop into both eyes 2 (two) times daily.   Yes [provider]  rosuvastatin (CRESTOR) 10 MG tablet Take 10 mg by mouth daily.   Yes [provider]  gabapentin (NEURONTIN) 300 MG capsule TAKE ONE CAPSULE BY MOUTH ONCE DAILY IN THE EVENING Patient not taking: Reported on 12/17/2018  10/08/15   Judi Saa, DO    Family History Family History  Problem Relation Age of Onset   Heart disease Mother    Heart disease Father     Social History Social History   Tobacco Use   Smoking status: Never Smoker   Smokeless tobacco: Never Used  Substance Use Topics   Alcohol use: Yes    Comment: socially   Drug use: No     Allergies   Patient has no known allergies.   Review of Systems Review of Systems  Constitutional: Negative for chills and fever.  HENT: Negative for sore throat and trouble swallowing.   Eyes: Negative for visual disturbance.  Respiratory: Negative for cough and shortness of breath.   Cardiovascular: Negative for chest pain, palpitations and leg swelling.  Gastrointestinal: Negative for abdominal pain, diarrhea, nausea and vomiting.  Genitourinary: Positive for frequency. Negative for dysuria.  Musculoskeletal: Negative for back pain and neck pain.  Skin: Negative for rash and wound.  Neurological: Positive for headaches. Negative for dizziness, syncope, weakness and light-headedness.  All other systems reviewed and are negative.    Physical Exam Updated Vital Signs BP (!) 188/94    Pulse (!) 101    Temp 99.2 F (37.3 C) (Oral)    Resp (!) 22    SpO2 92%   Physical Exam Vitals signs and nursing note reviewed.  Constitutional:      General: She is not in acute distress.    Appearance: Normal appearance. She is well-developed. She is not ill-appearing.  HENT:     Head: Normocephalic and atraumatic.     Comments: Cranial nerves II through XII grossly intact.    Nose: Nose normal.     Mouth/Throat:     Mouth: Mucous membranes are moist.  Eyes:     Extraocular Movements: Extraocular movements intact.     Pupils: Pupils are equal, round, and reactive to light.  Neck:     Comments: Cervical collar in place. Cardiovascular:     Rate and Rhythm: Normal rate and regular rhythm.     Heart sounds: No murmur. No friction rub. No  gallop.   Pulmonary:     Effort: Pulmonary effort is normal. No respiratory distress.     Breath sounds: Normal breath sounds. No stridor. No wheezing, rhonchi or rales.  Chest:     Chest wall: No tenderness.  Abdominal:     General: Bowel sounds are normal. There is no distension.     Palpations: Abdomen is soft.     Tenderness: There is no abdominal tenderness. There is no guarding or rebound.  Musculoskeletal: Normal range of motion.        General: No swelling, tenderness, deformity or signs of injury.     Right lower leg: No edema.     Left lower leg: No edema.     Comments:  Pelvis stable.  Distal pulses 2+.  Skin:    General: Skin is warm and dry.     Findings: No erythema or rash.  Neurological:     General: No focal deficit present.     Mental Status: She is alert and oriented to person, place, and time.     Comments: Patient is alert and oriented x3 with clear, goal oriented speech. Patient has 5/5 motor in all extremities. Sensation is intact to light touch.  Psychiatric:        Behavior: Behavior normal.      ED Treatments / Results  Labs (all labs ordered are listed, but only abnormal results are displayed) Labs Reviewed  COMPREHENSIVE METABOLIC PANEL - Abnormal; Notable for the following components:      Result Value   Potassium 3.0 (*)    Chloride 94 (*)    Glucose, Bld 114 (*)    Creatinine, Ser 1.13 (*)    GFR calc non Af Amer 44 (*)    GFR calc Af Amer 51 (*)    All other components within normal limits  CBC WITH DIFFERENTIAL/PLATELET  URINALYSIS, ROUTINE W REFLEX MICROSCOPIC    EKG EKG Interpretation  Date/Time:  Friday December 17 2018 20:44:09 EDT Ventricular Rate:  105 PR Interval:  174 QRS Duration: 94 QT Interval:  364 QTC Calculation: 481 R Axis:   -16 Text Interpretation:  Sinus tachycardia Left ventricular hypertrophy with repolarization abnormality ( Cornell product ) Abnormal ECG Confirmed by Julianne Rice [phone]) on 12/17/2018  10:00:57 PM   Radiology Ct Head Wo Contrast  Result Date: 12/17/2018 CLINICAL DATA:  Headache, post traumatic; C-spine trauma, high clinical risk (NEXUS/CCR) Patient reports losing her balance wall bringing in the lower striking back of head. Headache. EXAM: CT HEAD WITHOUT CONTRAST CT CERVICAL SPINE WITHOUT CONTRAST TECHNIQUE: Multidetector CT imaging of the head and cervical spine was performed following the standard protocol without intravenous contrast. Multiplanar CT image reconstructions of the cervical spine were also generated. COMPARISON:  CT 07/29/2018 FINDINGS: CT HEAD FINDINGS Brain: Stable atrophy and chronic small vessel ischemia from prior. No intracranial hemorrhage, mass effect, or midline shift. No hydrocephalus. The basilar cisterns are patent. No evidence of territorial infarct or acute ischemia. No extra-axial or intracranial fluid collection. Vascular: Atherosclerosis of skullbase vasculature without hyperdense vessel or abnormal calcification. Skull: No fracture or focal lesion. Sinuses/Orbits: No acute findings. Bilateral lens extraction. Paranasal sinuses and mastoid air cells are clear. Other: Left parietal scalp hematoma. CT CERVICAL SPINE FINDINGS Alignment: No traumatic subluxation. Degenerative retrolisthesis of C3 on C4 and anterolisthesis of C4 on C5, C7 on T1, unchanged from prior. Skull base and vertebrae: No acute fracture. Vertebral body heights are maintained. The dens and skull base are intact. Soft tissues and spinal canal: No prevertebral fluid or swelling. No visible canal hematoma. Disc levels: Multilevel disc space narrowing and endplate spurring. Mild canal narrowing at C5-C6 and C6-C7, unchanged. Multilevel facet hypertrophy. Upper chest: No acute finding. Other: Carotid atherosclerosis. IMPRESSION: 1. Left parietal scalp hematoma. No acute intracranial abnormality. No skull fracture. Stable atrophy and chronic small vessel ischemia. 2. Stable degenerative change  in the cervical spine without acute fracture or subluxation. 3. Carotid and skullbase atherosclerosis. Electronically Signed   By: Keith Rake M.D.   On: 12/17/2018 21:14   Ct Cervical Spine Wo Contrast  Result Date: 12/17/2018 CLINICAL DATA:  Headache, post traumatic; C-spine trauma, high clinical risk (NEXUS/CCR) Patient reports losing her balance wall bringing in the lower striking  back of head. Headache. EXAM: CT HEAD WITHOUT CONTRAST CT CERVICAL SPINE WITHOUT CONTRAST TECHNIQUE: Multidetector CT imaging of the head and cervical spine was performed following the standard protocol without intravenous contrast. Multiplanar CT image reconstructions of the cervical spine were also generated. COMPARISON:  CT 07/29/2018 FINDINGS: CT HEAD FINDINGS Brain: Stable atrophy and chronic small vessel ischemia from prior. No intracranial hemorrhage, mass effect, or midline shift. No hydrocephalus. The basilar cisterns are patent. No evidence of territorial infarct or acute ischemia. No extra-axial or intracranial fluid collection. Vascular: Atherosclerosis of skullbase vasculature without hyperdense vessel or abnormal calcification. Skull: No fracture or focal lesion. Sinuses/Orbits: No acute findings. Bilateral lens extraction. Paranasal sinuses and mastoid air cells are clear. Other: Left parietal scalp hematoma. CT CERVICAL SPINE FINDINGS Alignment: No traumatic subluxation. Degenerative retrolisthesis of C3 on C4 and anterolisthesis of C4 on C5, C7 on T1, unchanged from prior. Skull base and vertebrae: No acute fracture. Vertebral body heights are maintained. The dens and skull base are intact. Soft tissues and spinal canal: No prevertebral fluid or swelling. No visible canal hematoma. Disc levels: Multilevel disc space narrowing and endplate spurring. Mild canal narrowing at C5-C6 and C6-C7, unchanged. Multilevel facet hypertrophy. Upper chest: No acute finding. Other: Carotid atherosclerosis. IMPRESSION: 1.  Left parietal scalp hematoma. No acute intracranial abnormality. No skull fracture. Stable atrophy and chronic small vessel ischemia. 2. Stable degenerative change in the cervical spine without acute fracture or subluxation. 3. Carotid and skullbase atherosclerosis. Electronically Signed   By: Narda Rutherford M.D.   On: 12/17/2018 21:14    Procedures Procedures (including critical care time)  Medications Ordered in ED Medications  acetaminophen (TYLENOL) tablet 650 mg (650 mg Oral Given 12/17/18 2128)  cloNIDine (CATAPRES) tablet 0.1 mg (0.1 mg Oral Given 12/17/18 2128)     Initial Impression / Assessment and Plan / ED Course  I have reviewed the triage vital signs and the nursing notes.  Pertinent labs & imaging results that were available during my care of the patient were reviewed by me and considered in my medical decision making (see chart for details).        CT head and cervical spine without acute findings.  Laboratory work-up is normal.  Patient does have elevated blood pressure.  Advised to follow-up closely with her primary physician regarding possible medication adjustment.  Return precautions given.  Final Clinical Impressions(s) / ED Diagnoses   Final diagnoses:  Fall, initial encounter  Closed head injury, initial encounter  Elevated blood pressure reading    ED Discharge Orders    None       Loren Racer, MD 12/17/18 2201

## 2018-12-21 ENCOUNTER — Other Ambulatory Visit: Payer: Self-pay

## 2018-12-21 ENCOUNTER — Ambulatory Visit (HOSPITAL_COMMUNITY)
Admission: RE | Admit: 2018-12-21 | Discharge: 2018-12-21 | Disposition: A | Discharge status: Home / Self Care | Payer: Medicare Other | Source: Ambulatory Visit | Attending: Physician Assistant | Admitting: Physician Assistant

## 2018-12-21 DIAGNOSIS — R1310 Dysphagia, unspecified: Secondary | ICD-10-CM | POA: Diagnosis not present

## 2018-12-22 ENCOUNTER — Telehealth: Payer: Self-pay | Admitting: Internal Medicine

## 2018-12-22 NOTE — Telephone Encounter (Signed)
Patient called back to reschedule her endoscopy- she did not want to do it on election day because she knows that traffic will be terrible and her daughter needed to come with her. I have rescheduled her for 11/17 and let her know the COVID test would be rescheduled.

## 2019-01-04 ENCOUNTER — Other Ambulatory Visit: Payer: Self-pay

## 2019-01-04 ENCOUNTER — Encounter: Payer: Medicare Other | Admitting: Internal Medicine

## 2019-01-04 ENCOUNTER — Ambulatory Visit (AMBULATORY_SURGERY_CENTER): Payer: Medicare Other | Admitting: *Deleted

## 2019-01-04 VITALS — Temp 96.6°F | Ht 64.0 in | Wt 110.0 lb

## 2019-01-04 DIAGNOSIS — R1310 Dysphagia, unspecified: Secondary | ICD-10-CM

## 2019-01-04 DIAGNOSIS — Z1159 Encounter for screening for other viral diseases: Secondary | ICD-10-CM

## 2019-01-04 DIAGNOSIS — R1013 Epigastric pain: Secondary | ICD-10-CM

## 2019-01-04 NOTE — Progress Notes (Signed)
No egg or soy allergy known to patient  issues with past sedation with any surgeries  or procedures- hx of PONV  no intubation problems  No diet pills per patient No home 02 use per patient  No blood thinners per patient  Pt denies issues with constipation  No A fib or A flutter  EMMI video sent to pt's e mail  Daughters temp 96.4 in PV today  Due to the COVID-19 pandemic we are asking patients to follow these guidelines. Please only bring one care partner. Please be aware that your care partner may wait in the car in the parking lot or if they feel like they will be too hot to wait in the car, they may wait in the lobby on the 4th floor. All care partners are required to wear a mask the entire time (we do not have any that we can provide them), they need to practice social distancing, and we will do a Covid check for all patient's and care partners when you arrive. Also we will check their temperature and your temperature. If the care partner waits in their car they need to stay in the parking lot the entire time and we will call them on their cell phone when the patient is ready for discharge so they can bring the car to the front of the building. Also all patient's will need to wear a mask into building.  Covid test 11-12 Thursday at 1120 am

## 2019-01-13 ENCOUNTER — Other Ambulatory Visit: Payer: Self-pay | Admitting: Internal Medicine

## 2019-01-13 ENCOUNTER — Ambulatory Visit (INDEPENDENT_AMBULATORY_CARE_PROVIDER_SITE_OTHER): Payer: Medicare Other

## 2019-01-13 DIAGNOSIS — Z1159 Encounter for screening for other viral diseases: Secondary | ICD-10-CM

## 2019-01-14 LAB — SARS CORONAVIRUS 2 (TAT 6-24 HRS): SARS Coronavirus 2: NEGATIVE

## 2019-01-18 ENCOUNTER — Encounter: Payer: Self-pay | Admitting: Internal Medicine

## 2019-01-18 ENCOUNTER — Other Ambulatory Visit: Payer: Self-pay

## 2019-01-18 ENCOUNTER — Ambulatory Visit (AMBULATORY_SURGERY_CENTER): Payer: Medicare Other | Admitting: Internal Medicine

## 2019-01-18 VITALS — BP 188/108 | HR 101 | Temp 99.8°F | Resp 15 | Ht 64.0 in | Wt 110.0 lb

## 2019-01-18 DIAGNOSIS — K222 Esophageal obstruction: Secondary | ICD-10-CM | POA: Diagnosis not present

## 2019-01-18 DIAGNOSIS — R1310 Dysphagia, unspecified: Secondary | ICD-10-CM | POA: Diagnosis present

## 2019-01-18 DIAGNOSIS — R1013 Epigastric pain: Secondary | ICD-10-CM

## 2019-01-18 MED ORDER — SODIUM CHLORIDE 0.9 % IV SOLN: 500.0000 mL | Freq: Once | INTRAVENOUS | Status: DC

## 2019-01-18 NOTE — Progress Notes (Signed)
A/ox3, pleased with MAC, report to RN 

## 2019-01-18 NOTE — Patient Instructions (Signed)
Post dilation diet. - clear liquids until 3:45pm today.  Soft diet the rest of today and regular diet tomorrow.  YOU HAD AN ENDOSCOPIC PROCEDURE TODAY AT [facility]:   Refer to the procedure report that was given to you for any specific questions about what was found during the examination.  If the procedure report does not answer your questions, please call your gastroenterologist to clarify.  If you requested that your care partner not be given the details of your procedure findings, then the procedure report has been included in a sealed envelope for you to review at your convenience later.  YOU SHOULD EXPECT: Some feelings of bloating in the abdomen. Passage of more gas than usual.  Walking can help get rid of the air that was put into your GI tract during the procedure and reduce the bloating. If you had a lower endoscopy (such as a colonoscopy or flexible sigmoidoscopy) you may notice spotting of blood in your stool or on the toilet paper. If you underwent a bowel prep for your procedure, you may not have a normal bowel movement for a few days.  Please Note:  You might notice some irritation and congestion in your nose or some drainage.  This is from the oxygen used during your procedure.  There is no need for concern and it should clear up in a day or so.  SYMPTOMS TO REPORT IMMEDIATELY:   Following upper endoscopy (EGD)  Vomiting of blood or coffee ground material  New chest pain or pain under the shoulder blades  Painful or persistently difficult swallowing  New shortness of breath  Fever of 100F or higher  Black, tarry-looking stools  For urgent or emergent issues, a gastroenterologist can be reached at any hour by calling [phone].   DIET:  We do recommend a small meal at first, but then you may proceed to your regular diet.  Drink plenty of fluids but you should avoid alcoholic beverages for 24 hours.  ACTIVITY:  You should plan to take it easy for the  rest of today and you should NOT DRIVE or use heavy machinery until tomorrow (because of the sedation medicines used during the test).    FOLLOW UP: Our staff will call the number listed on your records 48-72 hours following your procedure to check on you and address any questions or concerns that you may have regarding the information given to you following your procedure. If we do not reach you, we will leave a message.  We will attempt to reach you two times.  During this call, we will ask if you have developed any symptoms of COVID 19. If you develop any symptoms (ie: fever, flu-like symptoms, shortness of breath, cough etc.) before then, please call [phone].  If you test positive for Covid 19 in the 2 weeks post procedure, please call and report this information to [place].    If any biopsies were taken you will be contacted by phone or by letter within the next 1-3 weeks.  Please call us at [phone] if you have not heard about the biopsies in 3 weeks.    SIGNATURES/CONFIDENTIALITY: You and/or your care partner have signed paperwork which will be entered into your electronic medical record.  These signatures attest to the fact that that the information above on your After Visit Summary has been reviewed and is understood.  Full responsibility of the confidentiality of this discharge information lies with you and/or your care-partner.

## 2019-01-18 NOTE — Op Note (Signed)
[facility] Patient Name: [name] Procedure Date: 01/18/2019 2:25 PM MRN: [id number] Endoscopist: Wilhemina Bonito. Marina Goodell , MD Age: 85 Referring MD:  Date of Birth: Jul 14, 1931 Gender: Female Account #: [id number] Procedure:                Upper GI endoscopy with [facility] dilation. 46 French Indications:              Dysphagia. Intermittent solid food such as bread.                            Points to proximal esophageal region Medicines:                Monitored Anesthesia Care Procedure:                Pre-Anesthesia Assessment:                           - Prior to the procedure, a History and Physical                            was performed, and patient medications and                            allergies were reviewed. The patient's tolerance of                            previous anesthesia was also reviewed. The risks                            and benefits of the procedure and the sedation                            options and risks were discussed with the patient.                            All questions were answered, and informed consent                            was obtained. Prior Anticoagulants: The patient has                            taken no previous anticoagulant or antiplatelet                            agents. ASA Grade Assessment: II - A patient with                            mild systemic disease. After reviewing the risks                            and benefits, the patient was deemed in                            satisfactory condition to undergo the procedure.  After obtaining informed consent, the endoscope was                            passed under direct vision. Throughout the                            procedure, the patient's blood pressure, pulse, and                            oxygen saturations were monitored continuously. The                            Endoscope was introduced through the mouth, and                advanced to the second part of duodenum. The upper                            GI endoscopy was accomplished without difficulty.                            The patient tolerated the procedure well. Scope In: Scope Out: Findings:                 The esophagus revealed a cervical web. It was                            somewhat tortuous and dilated below. No active                            inflammation. The scope was withdrawn after                            completing the endoscopic survey. Dilation was                            performed with a Maloney dilator with no resistance                            at 52 Fr. The dilation site was examined following                            endoscope reinsertion and showed moderate                            improvement in luminal narrowing as manifested by                            disruption of the web.                           The stomach was normal except for a small sliding                            hiatal hernia.  The examined duodenum was normal.                           The cardia and gastric fundus were normal on                            retroflexion. Complications:            No immediate complications. Estimated Blood Loss:     Estimated blood loss: none. Impression:               - Proximal esophageal web. Dilated. Otherwise                            normal esophagus                           - Normal stomach.                           - Normal examined duodenum.                           - No specimens collected. Recommendation:           - Patient has a contact number available for                            emergencies. The signs and symptoms of potential                            delayed complications were discussed with the                            patient. Return to normal activities tomorrow.                            Written discharge instructions were provided to the                             patient.                           - Post dilation diet.                           - Continue present medications.                           - Follow-up as needed Docia Chuck. Henrene Pastor, MD 01/18/2019 2:48:26 PM This report has been signed electronically.

## 2019-01-18 NOTE — Progress Notes (Signed)
Temp check by:LC Vital check by:CW  The patient states no changes in medical or surgical history since pre-visit screening on 01/04/2019.

## 2019-01-18 NOTE — Progress Notes (Signed)
Called to room to assist during endoscopic procedure.  Patient ID and intended procedure confirmed with present staff. Received instructions for my participation in the procedure from the performing physician.  

## 2019-01-20 ENCOUNTER — Telehealth: Payer: Self-pay

## 2019-01-20 NOTE — Telephone Encounter (Signed)
  Follow up Call-  Call back number 01/18/2019  Post procedure Call Back phone  # [phone]  Permission to leave phone message Yes  Some recent data might be hidden     Patient questions:  Do you have a fever, pain , or abdominal swelling? No. Pain Score  0 *  Have you tolerated food without any problems? Yes.    Have you been able to return to your normal activities? Yes.    Do you have any questions about your discharge instructions: Diet   No. Medications  No. Follow up visit  No.  Do you have questions or concerns about your Care? No.  Actions: * If pain score is 4 or above: No action needed, pain <4.  1. Have you developed a fever since your procedure? no  2.   Have you had an respiratory symptoms (SOB or cough) since your procedure? no  3.   Have you tested positive for COVID 19 since your procedure? no  4.   Have you had any family members/close contacts diagnosed with the COVID 19 since your procedure?  no   If yes to any of these questions please route to Joylene John, RN and Alphonsa Gin, Therapist, sports.

## 2019-06-22 ENCOUNTER — Other Ambulatory Visit: Payer: Self-pay | Admitting: Nurse Practitioner

## 2019-06-22 DIAGNOSIS — Z1231 Encounter for screening mammogram for malignant neoplasm of breast: Secondary | ICD-10-CM

## 2019-07-07 ENCOUNTER — Encounter (HOSPITAL_BASED_OUTPATIENT_CLINIC_OR_DEPARTMENT_OTHER): Payer: Self-pay

## 2019-07-07 ENCOUNTER — Emergency Department (HOSPITAL_BASED_OUTPATIENT_CLINIC_OR_DEPARTMENT_OTHER): Payer: Medicare Other

## 2019-07-07 ENCOUNTER — Other Ambulatory Visit: Payer: Self-pay

## 2019-07-07 ENCOUNTER — Emergency Department (HOSPITAL_BASED_OUTPATIENT_CLINIC_OR_DEPARTMENT_OTHER)
Admission: EM | Admit: 2019-07-07 | Discharge: 2019-07-07 | Disposition: A | Discharge status: Home / Self Care | Payer: Medicare Other | Attending: Emergency Medicine | Admitting: Emergency Medicine

## 2019-07-07 DIAGNOSIS — Y92512 Supermarket, store or market as the place of occurrence of the external cause: Secondary | ICD-10-CM | POA: Insufficient documentation

## 2019-07-07 DIAGNOSIS — S0101XA Laceration without foreign body of scalp, initial encounter: Secondary | ICD-10-CM | POA: Insufficient documentation

## 2019-07-07 DIAGNOSIS — Z79899 Other long term (current) drug therapy: Secondary | ICD-10-CM | POA: Diagnosis not present

## 2019-07-07 DIAGNOSIS — Y999 Unspecified external cause status: Secondary | ICD-10-CM | POA: Insufficient documentation

## 2019-07-07 DIAGNOSIS — S0990XA Unspecified injury of head, initial encounter: Secondary | ICD-10-CM

## 2019-07-07 DIAGNOSIS — I10 Essential (primary) hypertension: Secondary | ICD-10-CM | POA: Insufficient documentation

## 2019-07-07 DIAGNOSIS — Y9389 Activity, other specified: Secondary | ICD-10-CM | POA: Insufficient documentation

## 2019-07-07 DIAGNOSIS — W01198A Fall on same level from slipping, tripping and stumbling with subsequent striking against other object, initial encounter: Secondary | ICD-10-CM | POA: Diagnosis not present

## 2019-07-07 LAB — BASIC METABOLIC PANEL
Anion gap: 10 (ref 5–15)
BUN: 20 mg/dL (ref 8–23)
CO2: 29 mmol/L (ref 22–32)
Calcium: 9.9 mg/dL (ref 8.9–10.3)
Chloride: 99 mmol/L (ref 98–111)
Creatinine, Ser: 1.07 mg/dL — ABNORMAL HIGH (ref 0.44–1.00)
GFR calc Af Amer: 55 mL/min — ABNORMAL LOW (ref >60)
GFR calc non Af Amer: 47 mL/min — ABNORMAL LOW (ref >60)
Glucose, Bld: 98 mg/dL (ref 70–99)
Potassium: 4.2 mmol/L (ref 3.5–5.1)
Sodium: 138 mmol/L (ref 135–145)

## 2019-07-07 LAB — CBC WITH DIFFERENTIAL/PLATELET
Abs Immature Granulocytes: 0.03 10*3/uL (ref 0.00–0.07)
Basophils Absolute: 0.1 10*3/uL (ref 0.0–0.1)
Basophils Relative: 1 %
Eosinophils Absolute: 0.2 10*3/uL (ref 0.0–0.5)
Eosinophils Relative: 2 %
HCT: 41.9 % (ref 36.0–46.0)
Hemoglobin: 14 g/dL (ref 12.0–15.0)
Immature Granulocytes: 0 %
Lymphocytes Relative: 14 %
Lymphs Abs: 1.3 10*3/uL (ref 0.7–4.0)
MCH: 32.3 pg (ref 26.0–34.0)
MCHC: 33.4 g/dL (ref 30.0–36.0)
MCV: 96.8 fL (ref 80.0–100.0)
Monocytes Absolute: 0.6 10*3/uL (ref 0.1–1.0)
Monocytes Relative: 6 %
Neutro Abs: 6.9 10*3/uL (ref 1.7–7.7)
Neutrophils Relative %: 77 %
Platelets: 174 10*3/uL (ref 150–400)
RBC: 4.33 MIL/uL (ref 3.87–5.11)
RDW: 13.3 % (ref 11.5–15.5)
WBC: 9 10*3/uL (ref 4.0–10.5)
nRBC: 0 % (ref 0.0–0.2)

## 2019-07-07 LAB — CBG MONITORING, ED: Glucose-Capillary: 81 mg/dL (ref 70–99)

## 2019-07-07 MED ORDER — LIDOCAINE-EPINEPHRINE-TETRACAINE (LET) SOLUTION
3.0000 mL | Freq: Once | NASAL | Status: AC
  Administered 2019-07-07: 3 mL via TOPICAL
  Filled 2019-07-07 (×2): qty 3

## 2019-07-07 NOTE — Discharge Instructions (Signed)
Get help right away if: Your wound reopens and is draining. Your wound becomes red, swollen, hot, or tender. You develop a rash after the glue is applied. You have increasing pain in the wound. You have a red streak going away from the wound. You have pus coming from the wound. You have increased bleeding. You have a fever. You have shaking chills. You notice a bad smell coming from the wound. Your wound or the adhesive breaks open. 

## 2019-07-07 NOTE — ED Provider Notes (Signed)
[facility] EMERGENCY DEPARTMENT Provider Note   CSN: [id number] Arrival date & time: 07/07/19  1136     History Chief Complaint  Patient presents with  . Head Injury    [name] is a 86 y.o. female who presents emergency department with chief complaint of fall.  She has a history of recurrent falls.  Patient was out at the [employer] today.  She states she does not remember falling.  She denies chest pain or overt syncope.  She fell backward head is loss of consciousness, nausea, does not she is not taking any blood thinners.  HPI     Past Medical History:  Diagnosis Date  . Allergy   . Arthritis   . Cataract    removed both eyes   . GERD (gastroesophageal reflux disease)    controlled on meds   . Hyperlipidemia   . Hypertension   . Osteoporosis   . PONV (postoperative nausea and vomiting)   . Repeated falls    last fall 08-13-2016 per patient  . Urinary tract infection    recurrent    Patient Active Problem List   Diagnosis Date Noted  . PVD (peripheral vascular disease) (HCC) 11/24/2017  . Subclavian artery disease (HCC) 05/13/2017  . Bursitis of right shoulder 03/13/2015  . Concussion with no loss of consciousness 01/15/2015  . Fall at home 01/09/2015  . PAC (premature atrial contraction) 01/09/2015  . Nonallopathic lesion of lumbosacral region 02/22/2014  . Nonallopathic lesion of thoracic region 02/22/2014  . Dizziness and giddiness 09/26/2013  . Spinal stenosis, lumbar region, with neurogenic claudication 05/18/2013  . SI (sacroiliac) joint dysfunction 05/18/2013  . Nonallopathic lesion of sacral region 05/18/2013    Past Surgical History:  Procedure Laterality Date  . AORTA SURGERY  03/03/2010  . COLONOSCOPY    . CYST REMOVAL TRUNK     end of spine per pt   . IR KYPHO EA ADDL LEVEL THORACIC OR LUMBAR  10/01/2016  . IR KYPHO THORACIC WITH BONE BIOPSY  10/01/2016   T9, T 12  . IR RADIOLOGIST EVAL & MGMT  09/23/2016  . IR RADIOLOGIST  EVAL & MGMT  10/30/2016     OB History   No obstetric history on file.     Family History  Problem Relation Age of Onset  . Heart disease Mother   . Heart disease Father   . Colon cancer Maternal Uncle   . Colon polyps Neg Hx   . Esophageal cancer Neg Hx   . Rectal cancer Neg Hx   . Stomach cancer Neg Hx     Social History   Tobacco Use  . Smoking status: Never Smoker  . Smokeless tobacco: Never Used  Substance Use Topics  . Alcohol use: Yes    Comment: socially  . Drug use: No    Home Medications Prior to Admission medications   Medication Sig Start Date End Date Taking? Authorizing Provider  acetaminophen (TYLENOL) 500 MG tablet Take 1,000 mg by mouth 2 (two) times daily.     [provider]  amLODipine (NORVASC) 2.5 MG tablet Take 2.5 mg by mouth at bedtime. 12/23/18   [provider]  Ascorbic Acid (VITAMIN C PO) Take 1 tablet by mouth every morning.    [provider]  aspirin EC 81 MG tablet Take 81 mg by mouth every morning.     [provider]  Calcium Carbonate-Vitamin D (CALCIUM 600+D PO) Take 1 tablet by mouth 2 (two)  times daily.    [provider]  Cholecalciferol (VITAMIN D3 PO) Take 1 tablet by mouth every morning.    [provider]  gabapentin (NEURONTIN) 300 MG capsule TAKE ONE CAPSULE BY MOUTH ONCE DAILY IN THE EVENING Patient not taking: Reported on 12/17/2018 10/08/15   Judi Saa, DO  hydrochlorothiazide (MICROZIDE) 12.5 MG capsule Take 12.5 mg by mouth every Monday, Wednesday, and Friday.     [provider]  lisinopril (PRINIVIL,ZESTRIL) 10 MG tablet Take 10 mg by mouth daily.    [provider]  MAGNESIUM PO Take 1 tablet by mouth every morning.    [provider]  Multiple Vitamin (MULTIVITAMIN WITH MINERALS) TABS tablet Take 1 tablet by mouth every morning.    [provider]  oxybutynin (DITROPAN-XL) 5 MG 24 hr tablet Take 5 mg by mouth at bedtime.  11/29/18   [provider]  pantoprazole (PROTONIX) 40 MG tablet Take 1 tablet (40 mg total) by mouth daily. Take 30-60 minutes before meals Patient taking differently: Take 40 mg by mouth daily before supper.  12/02/18   Unk Lightning, PA  Polyethyl Glycol-Propyl Glycol (SYSTANE OP) Place 1 drop into both eyes 2 (two) times daily.    [provider]  potassium chloride (MICRO-K) 10 MEQ CR capsule Take 10 mEq by mouth daily. 12/30/18   [provider]  rosuvastatin (CRESTOR) 10 MG tablet Take 10 mg by mouth daily.    [provider]    Allergies    Patient has no known allergies.  Review of Systems   Review of Systems Ten systems reviewed and are negative for acute change, except as noted in the HPI.   Physical Exam Updated Vital Signs BP (!) 165/80 (BP Location: Left Arm)   Pulse 84   Temp 98.3 F (36.8 C) (Oral)   Resp 18   Ht 5\' 3"  (1.6 m)   Wt 49.9 kg   SpO2 97%   BMI 19.49 kg/m   Physical Exam Vitals and nursing note reviewed.  Constitutional:      General: She is not in acute distress.    Appearance: She is well-developed. She is not diaphoretic.  HENT:     Head: Normocephalic.     Comments: Hematoma to the posterior right scalp with 2 small superficial lacerations noted. Eyes:     General: No scleral icterus.    Conjunctiva/sclera: Conjunctivae normal.  Cardiovascular:     Rate and Rhythm: Normal rate and regular rhythm.     Heart sounds: Normal heart sounds. No murmur. No friction rub. No gallop.   Pulmonary:     Effort: Pulmonary effort is normal. No respiratory distress.     Breath sounds: Normal breath sounds.  Abdominal:     General: Bowel sounds are normal. There is no distension.     Palpations: Abdomen is soft. There is no mass.     Tenderness: There is no abdominal tenderness. There is no guarding.  Musculoskeletal:     Cervical back: Normal range of motion.  Skin:    General: Skin is warm and dry.    Neurological:     Mental Status: She is alert and oriented to person, place, and time.  Psychiatric:        Behavior: Behavior normal.     ED Results / Procedures / Treatments   Labs (all labs ordered are listed, but only abnormal results are displayed) Labs Reviewed  BASIC METABOLIC PANEL - Abnormal; Notable for the  following components:      Result Value   Creatinine, Ser 1.07 (*)    GFR calc non Af Amer 47 (*)    GFR calc Af Amer 55 (*)    All other components within normal limits  CBC WITH DIFFERENTIAL/PLATELET  URINALYSIS, ROUTINE W REFLEX MICROSCOPIC  CBG MONITORING, ED    EKG None  Radiology DG Ribs Unilateral W/Chest Left  Result Date: 07/07/2019 CLINICAL DATA:  Pain following fall EXAM: LEFT RIBS AND CHEST - 3+ VIEW COMPARISON:  Cervical spine CT Jul 07, 2019 FINDINGS: Frontal chest as well as oblique and cone-down rib images were obtained. Lungs are clear. Heart size and pulmonary vascularity are normal. No adenopathy. There is aortic atherosclerosis. There is a fracture of the posterior left first rib with mild displacement of fracture fragments. No other rib fracture evident. No demonstrable pneumothorax or pleural effusion. Note that patient has undergone kyphoplasty procedure in the lower thoracic region. IMPRESSION: Mildly displaced fracture of the posterior left first rib without pneumothorax. No other rib fracture. Previous kyphoplasty procedure in the lower thoracic region. Lungs clear. Heart size normal. Aortic Atherosclerosis (ICD10-I70.0). Electronically Signed   By: Bretta BangWilliam  Woodruff III M.D.   On: 07/07/2019 13:37   CT Head Wo Contrast  Result Date: 07/07/2019 CLINICAL DATA:  Fall, posterior head laceration EXAM: CT HEAD WITHOUT CONTRAST CT CERVICAL SPINE WITHOUT CONTRAST TECHNIQUE: Multidetector CT imaging of the head and cervical spine was performed following the standard protocol without intravenous contrast. Multiplanar CT image reconstructions of the  cervical spine were also generated. COMPARISON:  12/17/2018 FINDINGS: CT HEAD FINDINGS Brain: No evidence of acute infarction, hemorrhage, hydrocephalus, extra-axial collection or mass lesion/mass effect. Extensive low-density changes within the periventricular and subcortical white matter compatible with chronic microvascular ischemic change. Mild-moderate diffuse cerebral volume loss. Vascular: Atherosclerotic calcifications involving the large vessels of the skull base. No unexpected hyperdense vessel. Skull: Normal. Negative for fracture or focal lesion. Sinuses/Orbits: No acute finding. Other: Large high right posterior parietal scalp hematoma. CT CERVICAL SPINE FINDINGS Alignment: No facet joint dislocation. Dens and lateral masses are aligned. Degenerative grade 1 anterolisthesis C2 on C3 and C7 on T1. Trace retrolisthesis C3 on C4 and C5 on C6. Skull base and vertebrae: No acute fracture of the cervical spine. Subacute nondisplaced comminuted fracture of the posterior aspect of the left first rib with bony callus formation. No primary bone lesion or focal pathologic process. Soft tissues and spinal canal: No prevertebral fluid or swelling. No visible canal hematoma. Disc levels: Multilevel disc height loss and degenerative endplate spurring most pronounced at C3-4 through C6-7. Multilevel left greater than right facet arthropathy canal narrowing is again noted at the C3-4, C5-6, and C6-7 levels. No appreciable interval progression from prior CT. Upper chest: Lung apices clear. Other: Bilateral carotid artery calcification. IMPRESSION: 1. No evidence of acute intracranial abnormality. 2. Large high right posterior parietal scalp hematoma. 3. No acute fracture or traumatic listhesis of the cervical spine. 4. Subacute nondisplaced comminuted fracture of the posterior aspect of the left first rib with bony callus formation. 5. Advanced multilevel degenerative disc disease and facet arthropathy of the cervical  spine. Electronically Signed   By: Duanne GuessNicholas  Plundo D.O.   On: 07/07/2019 12:29   CT Cervical Spine Wo Contrast  Result Date: 07/07/2019 CLINICAL DATA:  Fall, posterior head laceration EXAM: CT HEAD WITHOUT CONTRAST CT CERVICAL SPINE WITHOUT CONTRAST TECHNIQUE: Multidetector CT imaging of the head and cervical spine was performed following the standard protocol without  intravenous contrast. Multiplanar CT image reconstructions of the cervical spine were also generated. COMPARISON:  12/17/2018 FINDINGS: CT HEAD FINDINGS Brain: No evidence of acute infarction, hemorrhage, hydrocephalus, extra-axial collection or mass lesion/mass effect. Extensive low-density changes within the periventricular and subcortical white matter compatible with chronic microvascular ischemic change. Mild-moderate diffuse cerebral volume loss. Vascular: Atherosclerotic calcifications involving the large vessels of the skull base. No unexpected hyperdense vessel. Skull: Normal. Negative for fracture or focal lesion. Sinuses/Orbits: No acute finding. Other: Large high right posterior parietal scalp hematoma. CT CERVICAL SPINE FINDINGS Alignment: No facet joint dislocation. Dens and lateral masses are aligned. Degenerative grade 1 anterolisthesis C2 on C3 and C7 on T1. Trace retrolisthesis C3 on C4 and C5 on C6. Skull base and vertebrae: No acute fracture of the cervical spine. Subacute nondisplaced comminuted fracture of the posterior aspect of the left first rib with bony callus formation. No primary bone lesion or focal pathologic process. Soft tissues and spinal canal: No prevertebral fluid or swelling. No visible canal hematoma. Disc levels: Multilevel disc height loss and degenerative endplate spurring most pronounced at C3-4 through C6-7. Multilevel left greater than right facet arthropathy canal narrowing is again noted at the C3-4, C5-6, and C6-7 levels. No appreciable interval progression from prior CT. Upper chest: Lung apices  clear. Other: Bilateral carotid artery calcification. IMPRESSION: 1. No evidence of acute intracranial abnormality. 2. Large high right posterior parietal scalp hematoma. 3. No acute fracture or traumatic listhesis of the cervical spine. 4. Subacute nondisplaced comminuted fracture of the posterior aspect of the left first rib with bony callus formation. 5. Advanced multilevel degenerative disc disease and facet arthropathy of the cervical spine. Electronically Signed   By: Duanne Guess D.O.   On: 07/07/2019 12:29    Procedures .Marland KitchenLaceration Repair  Date/Time: 07/07/2019 2:34 PM Performed by: Arthor Captain, PA-C Authorized by: Arthor Captain, PA-C   Consent:    Consent obtained:  Verbal   Consent given by:  Patient   Risks discussed:  Infection, need for additional repair, pain, poor cosmetic result and poor wound healing   Alternatives discussed:  No treatment and delayed treatment Universal protocol:    Procedure explained and questions answered to patient or proxy's satisfaction: yes     Relevant documents present and verified: yes     Test results available and properly labeled: yes     Imaging studies available: yes     Required blood products, implants, devices, and special equipment available: yes     Site/side marked: yes     Immediately prior to procedure, a time out was called: yes     Patient identity confirmed:  Verbally with patient Anesthesia (see MAR for exact dosages):    Anesthesia method:  Topical application   Topical anesthetic:  LET Laceration details:    Location:  Scalp   Scalp location:  R parietal   Length (cm):  1.5 Repair type:    Repair type:  Simple Pre-procedure details:    Preparation:  Patient was prepped and draped in usual sterile fashion Exploration:    Wound exploration: wound explored through full range of motion and entire depth of wound probed and visualized     Contaminated: no   Treatment:    Area cleansed with:  Saline   Amount of  cleaning:  Standard   Irrigation method:  Pressure wash   Visualized foreign bodies/material removed: no   Skin repair:    Repair method:  Tissue adhesive (hair apposition) Approximation:  Approximation:  Close Post-procedure details:    Dressing:  Open (no dressing)   Patient tolerance of procedure:  Tolerated well, no immediate complications .Marland KitchenLaceration Repair  Date/Time: 07/07/2019 2:38 PM Performed by: Arthor Captain, PA-C Authorized by: Arthor Captain, PA-C   Consent:    Consent obtained:  Verbal   Consent given by:  Patient   Risks discussed:  Infection, need for additional repair, pain, poor cosmetic result and poor wound healing   Alternatives discussed:  No treatment and delayed treatment Universal protocol:    Procedure explained and questions answered to patient or proxy's satisfaction: yes     Relevant documents present and verified: yes     Test results available and properly labeled: yes     Imaging studies available: yes     Required blood products, implants, devices, and special equipment available: yes     Site/side marked: yes     Immediately prior to procedure, a time out was called: yes     Patient identity confirmed:  Verbally with patient Anesthesia (see MAR for exact dosages):    Anesthesia method:  Topical application   Topical anesthetic:  LET Laceration details:    Location:  Scalp   Scalp location:  R parietal Repair type:    Repair type:  Simple Pre-procedure details:    Preparation:  Patient was prepped and draped in usual sterile fashion Exploration:    Wound exploration: wound explored through full range of motion and entire depth of wound probed and visualized     Contaminated: no   Treatment:    Area cleansed with:  Saline   Amount of cleaning:  Standard   (including critical care time)  Medications Ordered in ED Medications  lidocaine-EPINEPHrine-tetracaine (LET) solution (3 mLs Topical Given 07/07/19 1342)    ED Course  I have  reviewed the triage vital signs and the nursing notes.  Pertinent labs & imaging results that were available during my care of the patient were reviewed by me and considered in my medical decision making (see chart for details).    MDM Rules/Calculators/A&P                      86 year old female here with fall and head injury with scalp laceration.  She has had multiple falls in the past.  Her daughter is at bedside.  Question if this was potential syncope as she does not remember falling however the patient has had multiple work-ups for the same.  She is alert, oriented and at baseline without any confusion.  I personally ordered interpreted and reviewed patient's labs which show CBC without abnormality, no evidence of anemia, CBG within normal limits, BMP with baseline renal insufficiency.  EKG shows normal sinus rhythm at a rate of 89 without arrhythmia.  CT head and C-spine show no acute abnormalities.  Plain film of the ribs on the left with chest x-ray show a well-healing first rib fracture.  Daughter states that this occurred about a month ago.  It appears to be healing well there is no evidence of other fractures.  Patient was given wound care instructions.  She has no complaints at this time would like to go home.  She appears appropriate for discharge.  Close follow-up with PCP. Final Clinical Impression(s) / ED Diagnoses Final diagnoses:  None    Rx / DC Orders ED Discharge Orders    None       Arthor Captain, PA-C 07/07/19 1444    Dykstra,  Ellwood Dense, MD 07/08/19 [phone]

## 2019-07-07 NOTE — ED Triage Notes (Addendum)
Pt states she falls often-[place] [employer] [place]-struck back of her head-?lac wiith dried blood/hematoma noted-no bleeding at present-does not recall all the events and is unsure if LOC-to triage in w/c-NAD

## 2019-08-05 ENCOUNTER — Telehealth: Payer: Self-pay

## 2019-08-05 NOTE — Telephone Encounter (Signed)
NOTES ON FILE FROM [place] AT [facility] [phone], SENT REFERRAL TO SCHEDULING

## 2019-08-18 ENCOUNTER — Encounter: Payer: Self-pay | Admitting: *Deleted

## 2019-08-18 NOTE — Progress Notes (Signed)
Referring-Holly Turbyfill NP Reason for referral-Syncope  HPI: 86 yo female for evaluation of syncope at request of Billie Ruddy NP. Holter 2016 showed sinus with PACs and PVCs. Carotid dopplers 2019 showed 1-39 bilateral stenosis. Lower ext arterial dopplers 9/19 showed patent R common iliac stent and ABIs normal. Seen recently for increase in falls.  Patient states that she has had intermittent falls for 10 years.  She states that she loses her balance and goes backwards.  There is no preceding chest pain, palpitations, dyspnea, nausea.  She states she has not been consciousness but is clear that it is a balance issue.  She has hit her head causing concussions and loss of consciousness.  She otherwise denies dyspnea on exertion, chest pain.  Cardiology now asked to evaluate.  Current Outpatient Medications  Medication Sig Dispense Refill   acetaminophen (TYLENOL) 500 MG tablet Take 1,000 mg by mouth 2 (two) times daily.      amLODipine (NORVASC) 2.5 MG tablet Take 2.5 mg by mouth at bedtime.     Ascorbic Acid (VITAMIN C PO) Take 1 tablet by mouth every morning.     aspirin EC 81 MG tablet Take 81 mg by mouth every morning.      Calcium Carbonate-Vitamin D (CALCIUM 600+D PO) Take 1 tablet by mouth 2 (two) times daily.     Cholecalciferol (VITAMIN D3 PO) Take 1 tablet by mouth every morning.     hydrochlorothiazide (MICROZIDE) 12.5 MG capsule Take 12.5 mg by mouth every Monday, Wednesday, and Friday.      lisinopril (PRINIVIL,ZESTRIL) 10 MG tablet Take 10 mg by mouth daily.     MAGNESIUM PO Take 1 tablet by mouth every morning.     Multiple Vitamin (MULTIVITAMIN WITH MINERALS) TABS tablet Take 1 tablet by mouth every morning.     oxybutynin (DITROPAN-XL) 5 MG 24 hr tablet Take 5 mg by mouth at bedtime.     pantoprazole (PROTONIX) 40 MG tablet Take 1 tablet (40 mg total) by mouth daily. Take 30-60 minutes before meals (Patient taking differently: Take 40 mg by mouth daily before  supper. ) 90 tablet 2   Polyethyl Glycol-Propyl Glycol (SYSTANE OP) Place 1 drop into both eyes 2 (two) times daily.     potassium chloride (MICRO-K) 10 MEQ CR capsule Take 10 mEq by mouth daily.     rosuvastatin (CRESTOR) 10 MG tablet Take 10 mg by mouth daily.     No current facility-administered medications for this visit.    No Known Allergies   Past Medical History:  Diagnosis Date   Allergy    Arthritis    Cataract    removed both eyes    GERD (gastroesophageal reflux disease)    controlled on meds    Hyperlipidemia    Hypertension    Osteoporosis    PONV (postoperative nausea and vomiting)    Repeated falls    last fall 08-13-2016 per patient   Urinary tract infection    recurrent    Past Surgical History:  Procedure Laterality Date   AORTA SURGERY  03/03/2010   COLONOSCOPY     CYST REMOVAL TRUNK     end of spine per pt    IR KYPHO EA ADDL LEVEL THORACIC OR LUMBAR  10/01/2016   IR KYPHO THORACIC WITH BONE BIOPSY  10/01/2016   T9, T 12   IR RADIOLOGIST EVAL & MGMT  09/23/2016   IR RADIOLOGIST EVAL & MGMT  10/30/2016    Social History  Socioeconomic History   Marital status: Married    Spouse name: Not on file   Number of children: 2   Years of education: Not on file   Highest education level: Not on file  Occupational History   Not on file  Tobacco Use   Smoking status: Never Smoker   Smokeless tobacco: Never Used  Vaping Use   Vaping Use: Never used  Substance and Sexual Activity   Alcohol use: Yes    Comment: socially   Drug use: No   Sexual activity: Not on file  Other Topics Concern   Not on file  Social History Narrative   Not on file   Social Determinants of Health   Financial Resource Strain:    Difficulty of Paying Living Expenses:   Food Insecurity:    Worried About Programme researcher, broadcasting/film/video in the Last Year:    Barista in the Last Year:   Transportation Needs:    Freight forwarder  (Medical):    Lack of Transportation (Non-Medical):   Physical Activity:    Days of Exercise per Week:    Minutes of Exercise per Session:   Stress:    Feeling of Stress :   Social Connections:    Frequency of Communication with Friends and Family:    Frequency of Social Gatherings with Friends and Family:    Attends Religious Services:    Active Member of Clubs or Organizations:    Attends Engineer, structural:    Marital Status:   Intimate Partner Violence:    Fear of Current or Ex-Partner:    Emotionally Abused:    Physically Abused:    Sexually Abused:     Family History  Problem Relation Age of Onset   Heart disease Mother    Heart disease Father    Colon cancer Maternal Uncle    Colon polyps Neg Hx    Esophageal cancer Neg Hx    Rectal cancer Neg Hx    Stomach cancer Neg Hx     ROS: no fevers or chills, productive cough, hemoptysis, dysphasia, odynophagia, melena, hematochezia, dysuria, hematuria, rash, seizure activity, orthopnea, PND, pedal edema, claudication. Remaining systems are negative.  Physical Exam:   Blood pressure 136/78, pulse 71, height 5\' 3"  (1.6 m), weight 104 lb 12.8 oz (47.5 kg), SpO2 97 %.  General:  Well developed/frail in NAD Skin warm/dry Patient not depressed No peripheral clubbing Back-normal HEENT-small healing laceration in occipital area. Neck supple/normal carotid upstroke bilaterally; no bruits; no JVD; no thyromegaly chest - CTA/ normal expansion CV - RRR/normal S1 and S2; no murmurs, rubs or gallops;  PMI nondisplaced Abdomen -NT/ND, no HSM, no mass, + bowel sounds, no bruit 2+ femoral pulses, no bruits Ext-no edema, chords, 2+ DP Neuro-grossly nonfocal  ECG - 07/07/19-Sinus with nonspecific ST changes; personally reviewed  A/P  1 syncope-patient is clear that she merely loses her balance and does not lose consciousness.  I think this is unlikely to be cardiac based on description.  However we  will arrange an echocardiogram to assess LV function and a 30-day event monitor.  2 hypertension-BP controlled; continue present meds.  3 Hyperlipidemia-continue statin.  4 peripheral vascular disease-continue aspirin and statin.  09/06/19, MD

## 2019-08-18 NOTE — Progress Notes (Signed)
Note faxed to [facility]

## 2019-08-19 ENCOUNTER — Telehealth: Payer: Self-pay | Admitting: Cardiology

## 2019-08-19 NOTE — Telephone Encounter (Signed)
Spoke with pt, okay given for daughter to attend appointment.

## 2019-08-19 NOTE — Telephone Encounter (Signed)
Patient states that her daughter will have to accompany her to her appt with Dr. Jens Som on 08/24/19 in [place].

## 2019-08-24 ENCOUNTER — Encounter: Payer: Self-pay | Admitting: Cardiology

## 2019-08-24 ENCOUNTER — Ambulatory Visit (INDEPENDENT_AMBULATORY_CARE_PROVIDER_SITE_OTHER): Payer: Medicare Other | Admitting: Cardiology

## 2019-08-24 ENCOUNTER — Other Ambulatory Visit: Payer: Self-pay

## 2019-08-24 ENCOUNTER — Telehealth: Payer: Self-pay | Admitting: Radiology

## 2019-08-24 VITALS — BP 136/78 | HR 71 | Ht 63.0 in | Wt 104.8 lb

## 2019-08-24 DIAGNOSIS — E7800 Pure hypercholesterolemia, unspecified: Secondary | ICD-10-CM | POA: Diagnosis not present

## 2019-08-24 DIAGNOSIS — R55 Syncope and collapse: Secondary | ICD-10-CM | POA: Diagnosis not present

## 2019-08-24 DIAGNOSIS — I10 Essential (primary) hypertension: Secondary | ICD-10-CM

## 2019-08-24 NOTE — Telephone Encounter (Signed)
Enrolled patient for a 30 day [employer] Event Monitor to be mailed to patients home  

## 2019-08-24 NOTE — Patient Instructions (Signed)
Medication Instructions:  NO CHANGE *If you need a refill on your cardiac medications before your next appointment, please call your pharmacy*   Lab Work: If you have labs (blood work) drawn today and your tests are completely normal, you will receive your results only by: Marland Kitchen MyChart Message (if you have MyChart) OR . A paper copy in the mail If you have any lab test that is abnormal or we need to change your treatment, we will call you to review the results.   Testing/Procedures: Your physician has requested that you have an echocardiogram. Echocardiography is a painless test that uses sound waves to create images of your heart. It provides your doctor with information about the size and shape of your heart and how well your heart's chambers and valves are working. This procedure takes approximately one hour. There are no restrictions for this procedure.[facility]  Your physician has recommended that you wear a 30 DAY event monitor. Event monitors are medical devices that record the heart's electrical activity. Doctors most often [place] these monitors to diagnose arrhythmias. Arrhythmias are problems with the speed or rhythm of the heartbeat. The monitor is a small, portable device. You can wear one while you do your normal daily activities. This is usually used to diagnose what is causing palpitations/syncope (passing out).WILL BE MAILED TO YOUR HOME   Follow-Up: At [facility], you and your health needs are our priority.  As part of our continuing mission to provide you with exceptional heart care, we have created designated Provider Care Teams.  These Care Teams include your primary Cardiologist (physician) and Advanced Practice Providers (APPs -  Physician Assistants and Nurse Practitioners) who all work together to provide you with the care you need, when you need it.  We recommend signing up for the patient portal called "MyChart".  Sign up information is provided on this After Visit  Summary.  MyChart is used to connect with patients for Virtual Visits (Telemedicine).  Patients are able to view lab/test results, encounter notes, upcoming appointments, etc.  Non-urgent messages can be sent to your provider as well.   To learn more about what you can do with MyChart, go to [email or web address].    Your next appointment:   6 month(s)  The format for your next appointment:   Either In Person or Virtual  Provider:   Olga Millers, MD

## 2019-08-31 ENCOUNTER — Encounter (INDEPENDENT_AMBULATORY_CARE_PROVIDER_SITE_OTHER): Payer: Medicare Other

## 2019-08-31 DIAGNOSIS — R55 Syncope and collapse: Secondary | ICD-10-CM

## 2019-09-23 ENCOUNTER — Ambulatory Visit (HOSPITAL_BASED_OUTPATIENT_CLINIC_OR_DEPARTMENT_OTHER): Payer: Medicare Other

## 2019-12-07 ENCOUNTER — Ambulatory Visit (INDEPENDENT_AMBULATORY_CARE_PROVIDER_SITE_OTHER): Payer: Medicare Other

## 2019-12-07 ENCOUNTER — Ambulatory Visit: Payer: Medicare Other

## 2019-12-07 ENCOUNTER — Other Ambulatory Visit: Payer: Self-pay

## 2019-12-07 DIAGNOSIS — Z1231 Encounter for screening mammogram for malignant neoplasm of breast: Secondary | ICD-10-CM | POA: Diagnosis not present

## 2019-12-12 ENCOUNTER — Other Ambulatory Visit: Payer: Self-pay | Admitting: Nurse Practitioner

## 2019-12-12 DIAGNOSIS — R928 Other abnormal and inconclusive findings on diagnostic imaging of breast: Secondary | ICD-10-CM

## 2019-12-22 ENCOUNTER — Ambulatory Visit
Admission: RE | Admit: 2019-12-22 | Discharge: 2019-12-22 | Disposition: A | Discharge status: Home / Self Care | Payer: Medicare Other | Source: Ambulatory Visit | Attending: Nurse Practitioner | Admitting: Nurse Practitioner

## 2019-12-22 ENCOUNTER — Other Ambulatory Visit: Payer: Self-pay

## 2019-12-22 ENCOUNTER — Ambulatory Visit: Payer: Medicare Other

## 2019-12-22 DIAGNOSIS — R928 Other abnormal and inconclusive findings on diagnostic imaging of breast: Secondary | ICD-10-CM

## 2020-02-08 NOTE — Progress Notes (Deleted)
HPI: FU syncope. Holter 2016 showed sinus with PACs and PVCs. Carotid dopplers 2019 showed 1-39 bilateral stenosis. Lower ext arterial dopplers 9/19 showed patent R common iliac stent and ABIs normal. Seen recently for increase in falls (felt possibly syncope by primary care but patient was clear she merely loses her balance).  Monitor July 2021 showed sinus rhythm to sinus tachycardia.  Echocardiogram was ordered but not performed.  Since last seen  Current Outpatient Medications  Medication Sig Dispense Refill  . acetaminophen (TYLENOL) 500 MG tablet Take 1,000 mg by mouth 2 (two) times daily.     Marland Kitchen amLODipine (NORVASC) 2.5 MG tablet Take 2.5 mg by mouth at bedtime.    . Ascorbic Acid (VITAMIN C PO) Take 1 tablet by mouth every morning.    Marland Kitchen aspirin EC 81 MG tablet Take 81 mg by mouth every morning.     . Calcium Carbonate-Vitamin D (CALCIUM 600+D PO) Take 1 tablet by mouth 2 (two) times daily.    . Cholecalciferol (VITAMIN D3 PO) Take 1 tablet by mouth every morning.    . hydrochlorothiazide (MICROZIDE) 12.5 MG capsule Take 12.5 mg by mouth every Monday, Wednesday, and Friday.     Marland Kitchen lisinopril (PRINIVIL,ZESTRIL) 10 MG tablet Take 10 mg by mouth daily.    Marland Kitchen MAGNESIUM PO Take 1 tablet by mouth every morning.    . Multiple Vitamin (MULTIVITAMIN WITH MINERALS) TABS tablet Take 1 tablet by mouth every morning.    Marland Kitchen oxybutynin (DITROPAN-XL) 5 MG 24 hr tablet Take 5 mg by mouth at bedtime.    . pantoprazole (PROTONIX) 40 MG tablet Take 1 tablet (40 mg total) by mouth daily. Take 30-60 minutes before meals (Patient taking differently: Take 40 mg by mouth daily before supper. ) 90 tablet 2  . Polyethyl Glycol-Propyl Glycol (SYSTANE OP) Place 1 drop into both eyes 2 (two) times daily.    . potassium chloride (MICRO-K) 10 MEQ CR capsule Take 10 mEq by mouth daily.    . rosuvastatin (CRESTOR) 10 MG tablet Take 10 mg by mouth daily.     No current facility-administered medications for this visit.      Past Medical History:  Diagnosis Date  . Allergy   . Arthritis   . Cataract    removed both eyes   . GERD (gastroesophageal reflux disease)    controlled on meds   . Hyperlipidemia   . Hypertension   . Osteoporosis   . PONV (postoperative nausea and vomiting)   . Repeated falls    last fall 08-13-2016 per patient  . Urinary tract infection    recurrent    Past Surgical History:  Procedure Laterality Date  . AORTA SURGERY  03/03/2010  . COLONOSCOPY    . CYST REMOVAL TRUNK     end of spine per pt   . IR KYPHO EA ADDL LEVEL THORACIC OR LUMBAR  10/01/2016  . IR KYPHO THORACIC WITH BONE BIOPSY  10/01/2016   T9, T 12  . IR RADIOLOGIST EVAL & MGMT  09/23/2016  . IR RADIOLOGIST EVAL & MGMT  10/30/2016    Social History   Socioeconomic History  . Marital status: Married    Spouse name: Not on file  . Number of children: 2  . Years of education: Not on file  . Highest education level: Not on file  Occupational History  . Not on file  Tobacco Use  . Smoking status: Never Smoker  . Smokeless tobacco: Never Used  Vaping Use  . Vaping Use: Never used  Substance and Sexual Activity  . Alcohol use: Yes    Comment: socially  . Drug use: No  . Sexual activity: Not on file  Other Topics Concern  . Not on file  Social History Narrative  . Not on file   Social Determinants of Health   Financial Resource Strain:   . Difficulty of Paying Living Expenses: Not on file  Food Insecurity:   . Worried About Programme researcher, broadcasting/film/video in the Last Year: Not on file  . Ran Out of Food in the Last Year: Not on file  Transportation Needs:   . Lack of Transportation (Medical): Not on file  . Lack of Transportation (Non-Medical): Not on file  Physical Activity:   . Days of Exercise per Week: Not on file  . Minutes of Exercise per Session: Not on file  Stress:   . Feeling of Stress : Not on file  Social Connections:   . Frequency of Communication with Friends and Family: Not on file  .  Frequency of Social Gatherings with Friends and Family: Not on file  . Attends Religious Services: Not on file  . Active Member of Clubs or Organizations: Not on file  . Attends Banker Meetings: Not on file  . Marital Status: Not on file  Intimate Partner Violence:   . Fear of Current or Ex-Partner: Not on file  . Emotionally Abused: Not on file  . Physically Abused: Not on file  . Sexually Abused: Not on file    Family History  Problem Relation Age of Onset  . Heart disease Mother   . Heart disease Father   . Colon cancer Maternal Uncle   . Colon polyps Neg Hx   . Esophageal cancer Neg Hx   . Rectal cancer Neg Hx   . Stomach cancer Neg Hx     ROS: no fevers or chills, productive cough, hemoptysis, dysphasia, odynophagia, melena, hematochezia, dysuria, hematuria, rash, seizure activity, orthopnea, PND, pedal edema, claudication. Remaining systems are negative.  Physical Exam: Well-developed well-nourished in no acute distress.  Skin is warm and dry.  HEENT is normal.  Neck is supple.  Chest is clear to auscultation with normal expansion.  Cardiovascular exam is regular rate and rhythm.  Abdominal exam nontender or distended. No masses palpated. Extremities show no edema. neuro grossly intact  ECG- personally reviewed  A/P  1 syncope-as outlined in previous note patient is clear that she loses her balance and does not have loss of consciousness.  Previous monitor negative.  We will reorder echocardiogram but otherwise will not pursue further evaluation.  2 hypertension-patient's blood pressure is controlled today.  Continue present medical regimen and follow.  3 hyperlipidemia-continue statin.  4 peripheral vascular disease-continue aspirin and statin.  Olga Millers, MD

## 2020-02-15 ENCOUNTER — Ambulatory Visit: Payer: Medicare Other | Admitting: Cardiology

## 2020-11-28 ENCOUNTER — Other Ambulatory Visit: Payer: Self-pay | Admitting: Nurse Practitioner

## 2020-11-28 DIAGNOSIS — Z1231 Encounter for screening mammogram for malignant neoplasm of breast: Secondary | ICD-10-CM

## 2020-12-26 ENCOUNTER — Other Ambulatory Visit: Payer: Self-pay

## 2020-12-26 ENCOUNTER — Ambulatory Visit (INDEPENDENT_AMBULATORY_CARE_PROVIDER_SITE_OTHER): Payer: Medicare Other

## 2020-12-26 DIAGNOSIS — Z1231 Encounter for screening mammogram for malignant neoplasm of breast: Secondary | ICD-10-CM | POA: Diagnosis not present

## 2021-12-03 ENCOUNTER — Other Ambulatory Visit: Payer: Self-pay | Admitting: Nurse Practitioner

## 2021-12-03 DIAGNOSIS — Z1231 Encounter for screening mammogram for malignant neoplasm of breast: Secondary | ICD-10-CM

## 2022-01-01 ENCOUNTER — Encounter: Payer: Self-pay | Admitting: Emergency Medicine

## 2022-01-01 ENCOUNTER — Ambulatory Visit
Admission: EM | Admit: 2022-01-01 | Discharge: 2022-01-01 | Disposition: A | Discharge status: Home / Self Care | Payer: Medicare Other | Attending: Family Medicine | Admitting: Family Medicine

## 2022-01-01 ENCOUNTER — Ambulatory Visit (INDEPENDENT_AMBULATORY_CARE_PROVIDER_SITE_OTHER): Payer: Medicare Other

## 2022-01-01 DIAGNOSIS — R42 Dizziness and giddiness: Secondary | ICD-10-CM | POA: Diagnosis not present

## 2022-01-01 DIAGNOSIS — W19XXXA Unspecified fall, initial encounter: Secondary | ICD-10-CM

## 2022-01-01 DIAGNOSIS — S0990XA Unspecified injury of head, initial encounter: Secondary | ICD-10-CM | POA: Diagnosis not present

## 2022-01-01 DIAGNOSIS — Z1231 Encounter for screening mammogram for malignant neoplasm of breast: Secondary | ICD-10-CM | POA: Diagnosis not present

## 2022-01-01 DIAGNOSIS — M542 Cervicalgia: Secondary | ICD-10-CM

## 2022-01-01 NOTE — Discharge Instructions (Signed)
The CAT scan was negative for any brain injury Give Tylenol for any neck pain or headache If you develop any symptoms that are worrisome such as confusion, loss of balance, nausea, vomiting, change in vision you must go to an emergency room Follow-up with your usual physician

## 2022-01-01 NOTE — ED Provider Notes (Signed)
[facility]    CSN: [id number] Arrival date & time: 01/01/22  0919      History   Chief Complaint Chief Complaint  Patient presents with   Head Injury    HPI [name] is a 88 y.o. female.   HPI  Pleasant 88 year old woman.  Came to [facility] for a mammogram today.  She does have history of memory impairment and lives at an assisted living center.  She is here with one of her caregivers.  She has a history of repeated falls.  She has a history of intermittent vertigo.  Chart is reviewed.  Medications are updated Today after having the mammogram the patient stepped backwards and lost her balance.  She fell backwards and hit the back of her head against a table.  Also bumped her right elbow.  She has had surgery on this right elbow.  It is only mildly painful.  While she was down in radiology she stated she was dizzy.  She was brought up to the urgent care center for evaluation.  There was no loss of consciousness.  She has no change in her mental status or speech.  No reported change in vision.  No numbness or weakness in arms or legs.  No problems with balance, she does walk with a walker.  Her caregiver states she is at her baseline.  Complains of mild neck pain.  Complains of mild elbow pain.  States that she has a headache but it is no worse than her "normal" headache   Past Medical History:  Diagnosis Date   Allergy    Arthritis    Cataract    removed both eyes    GERD (gastroesophageal reflux disease)    controlled on meds    Hyperlipidemia    Hypertension    Osteoporosis    PONV (postoperative nausea and vomiting)    Repeated falls    last fall 08-13-2016 per patient   Urinary tract infection    recurrent    Patient Active Problem List   Diagnosis Date Noted   PVD (peripheral vascular disease) (HCC) 11/24/2017   Subclavian artery disease (HCC) 05/13/2017   Bursitis of right shoulder 03/13/2015   Concussion with no loss of  consciousness 01/15/2015   Fall at home 01/09/2015   PAC (premature atrial contraction) 01/09/2015   Nonallopathic lesion of lumbosacral region 02/22/2014   Nonallopathic lesion of thoracic region 02/22/2014   Dizziness and giddiness 09/26/2013   Spinal stenosis, lumbar region, with neurogenic claudication 05/18/2013   SI (sacroiliac) joint dysfunction 05/18/2013   Nonallopathic lesion of sacral region 05/18/2013    Past Surgical History:  Procedure Laterality Date   AORTA SURGERY  03/03/2010   CHOLECYSTECTOMY     COLONOSCOPY     CYST REMOVAL TRUNK     end of spine per pt    IR KYPHO EA ADDL LEVEL THORACIC OR LUMBAR  10/01/2016   IR KYPHO THORACIC WITH BONE BIOPSY  10/01/2016   T9, T 12   IR RADIOLOGIST EVAL & MGMT  09/23/2016   IR RADIOLOGIST EVAL & MGMT  10/30/2016    OB History   No obstetric history on file.      Home Medications    Prior to Admission medications   Medication Sig Start Date End Date Taking? Authorizing Provider  amLODipine (NORVASC) 2.5 MG tablet Take 1 tablet by mouth daily. 12/16/21  Yes [provider]  gabapentin (NEURONTIN) 100 MG capsule Take 1 capsule by  mouth daily. 06/10/16  Yes [provider]  acetaminophen (TYLENOL) 500 MG tablet Take 1,000 mg by mouth 2 (two) times daily.     [provider]  amLODipine (NORVASC) 2.5 MG tablet Take 2.5 mg by mouth at bedtime. 12/23/18   [provider]  Ascorbic Acid (VITAMIN C PO) Take 1 tablet by mouth every morning.    [provider]  aspirin EC 81 MG tablet Take 81 mg by mouth every morning.     [provider]  aspirin EC 81 MG tablet Take 1 tablet by mouth daily.    [provider]  Calcium Carbonate-Vitamin D (CALCIUM 600+D PO) Take 1 tablet by mouth 2 (two) times daily.    [provider]  Cholecalciferol (VITAMIN D3 PO) Take 1 tablet by mouth every morning.    [provider]  hydrochlorothiazide (MICROZIDE) 12.5 MG  capsule Take 12.5 mg by mouth every Monday, Wednesday, and Friday.     [provider]  lisinopril (PRINIVIL,ZESTRIL) 10 MG tablet Take 10 mg by mouth daily.    [provider]  MAGNESIUM PO Take 1 tablet by mouth every morning.    [provider]  mirabegron ER (MYRBETRIQ) 25 MG TB24 tablet     [provider]  mirtazapine (REMERON) 7.5 MG tablet Take 7.5 mg by mouth at bedtime. 08/19/21   [provider]  Multiple Vitamin (MULTIVITAMIN WITH MINERALS) TABS tablet Take 1 tablet by mouth every morning.    [provider]  oxybutynin (DITROPAN-XL) 5 MG 24 hr tablet Take 5 mg by mouth at bedtime. 11/29/18   [provider]  pantoprazole (PROTONIX) 40 MG tablet Take 1 tablet (40 mg total) by mouth daily. Take 30-60 minutes before meals Patient taking differently: Take 40 mg by mouth daily before supper.  12/02/18   Unk Lightning, PA  Polyethyl Glycol-Propyl Glycol (SYSTANE OP) Place 1 drop into both eyes 2 (two) times daily.    [provider]  potassium chloride (MICRO-K) 10 MEQ CR capsule Take 10 mEq by mouth daily. 12/30/18   [provider]  rosuvastatin (CRESTOR) 10 MG tablet Take 10 mg by mouth daily.    [provider]  traMADol (ULTRAM) 50 MG tablet     [provider]  traZODone (DESYREL) 50 MG tablet Take 50 mg by mouth at bedtime. 12/11/21   [provider]    Family History Family History  Problem Relation Age of Onset   Heart disease Mother    Heart disease Father    Colon cancer Maternal Uncle    Colon polyps Neg Hx    Esophageal cancer Neg Hx    Rectal cancer Neg Hx    Stomach cancer Neg Hx     Social History Social History   Tobacco Use   Smoking status: Never   Smokeless tobacco: Never  Vaping Use   Vaping Use: Never used  Substance Use Topics   Alcohol use: Yes    Comment: socially   Drug use: No     Allergies   Patient has no known  allergies.   Review of Systems Review of Systems See HPI  Physical Exam Triage Vital Signs ED Triage Vitals  Enc Vitals Group     BP 01/01/22 0943 111/78     Pulse Rate 01/01/22 0943 76     Resp 01/01/22 0943 16     Temp 01/01/22 0943 97.9 F (36.6 C)     Temp Source 01/01/22 0943  Oral     SpO2 01/01/22 0943 97 %     Weight --      Height --      Head Circumference --      Peak Flow --      Pain Score 01/01/22 0945 0     Pain Loc --      Pain Edu? --      Excl. in [place]? --    No data found.  Updated Vital Signs BP 111/78 (BP Location: Right Arm)   Pulse 76   Temp 97.9 F (36.6 C) (Oral)   Resp 16   SpO2 97%      Physical Exam Constitutional:      General: She is not in acute distress.    Appearance: She is well-developed and normal weight.     Comments: Somewhat frail in appearance.  Ambulates with walker.  Slow steady steps  HENT:     Head: Normocephalic and atraumatic.     Comments: No visible bruising or palpable defect on skull    Right Ear: Tympanic membrane and ear canal normal.     Left Ear: Tympanic membrane and ear canal normal.     Nose: Nose normal. No congestion.     Mouth/Throat:     Mouth: Mucous membranes are moist.     Pharynx: No posterior oropharyngeal erythema.  Eyes:     Conjunctiva/sclera: Conjunctivae normal.     Pupils: Pupils are equal, round, and reactive to light.     Comments: Pupils are equal  Neck:     Comments: Slow but full range of motion of neck.  No tenderness Cardiovascular:     Rate and Rhythm: Normal rate.  Pulmonary:     Effort: Pulmonary effort is normal. No respiratory distress.  Abdominal:     General: There is no distension.     Palpations: Abdomen is soft.  Musculoskeletal:        General: Normal range of motion.     Cervical back: Normal range of motion. No rigidity or tenderness.  Skin:    General: Skin is warm and dry.  Neurological:     General: No focal deficit present.     Mental Status: She is  alert.  Psychiatric:        Mood and Affect: Mood normal.        Behavior: Behavior normal.      UC Treatments / Results  Labs (all labs ordered are listed, but only abnormal results are displayed) Labs Reviewed - No data to display  EKG   Radiology CT HEAD WO CONTRAST (5MM)  Result Date: 01/01/2022 CLINICAL DATA:  Head trauma, minor (Age >= 65y) EXAM: CT HEAD WITHOUT CONTRAST TECHNIQUE: Contiguous axial images were obtained from the base of the skull through the vertex without intravenous contrast. RADIATION DOSE REDUCTION: This exam was performed according to the departmental dose-optimization program which includes automated exposure control, adjustment of the mA and/or kV according to patient size and/or use of iterative reconstruction technique. COMPARISON:  07/07/2019 FINDINGS: Brain: No evidence of acute infarction, hemorrhage, hydrocephalus, extra-axial collection or mass lesion/mass effect. Extensive low-density changes within the periventricular and subcortical white matter compatible with chronic microvascular ischemic change. Moderate diffuse cerebral volume loss. Vascular: Atherosclerotic calcifications involving the large vessels of the skull base. No unexpected hyperdense vessel. Skull: Normal. Negative for fracture or focal lesion. Sinuses/Orbits: No acute finding. Other: None. IMPRESSION: 1. No acute intracranial findings. 2. Chronic microvascular ischemic change and cerebral volume  loss. Electronically Signed   By: Duanne Guess D.O.   On: 01/01/2022 10:50    Procedures Procedures (including critical care time)  Medications Ordered in UC Medications - No data to display  Initial Impression / Assessment and Plan / UC Course  I have reviewed the triage vital signs and the nursing notes.  Pertinent labs & imaging results that were available during my care of the patient were reviewed by me and considered in my medical decision making (see chart for details).      First and foremost I recommended this patient be seen at the emergency room for a trauma evaluation The patient refuses to go to the emergency room therefore I gave her a second choice of having a CT scan done here.  She has no symptoms or physical findings suggestive of a head injury, however with trauma at her age I feel a CT scan is necessary. The CT is normal.  I did recommend the caregiver watch her carefully today for any change in symptoms. Final Clinical Impressions(s) / UC Diagnoses   Final diagnoses:  Minor head injury, initial encounter  Vertigo  Neck pain  Fall, initial encounter     Discharge Instructions      The CAT scan was negative for any brain injury Give Tylenol for any neck pain or headache If you develop any symptoms that are worrisome such as confusion, loss of balance, nausea, vomiting, change in vision you must go to an emergency room Follow-up with your usual physician   ED Prescriptions   None    PDMP not reviewed this encounter.   Eustace Moore, MD 01/01/22 [phone]

## 2022-01-01 NOTE — ED Triage Notes (Signed)
Pt was having a mammogram in our imaging dept this am and states she fell back on the exam table and hit her head and right elbow. She is here with helen from imaging and states she did not lose consciousness and no c/o pain today.

## 2022-01-02 ENCOUNTER — Telehealth: Payer: Self-pay

## 2022-01-02 NOTE — Telephone Encounter (Signed)
TC to f/u with pt after yesterday's visit to [facility]. Pt states she is feeling okay and has no problems or questions at this time.

## 2023-04-21 ENCOUNTER — Emergency Department (HOSPITAL_BASED_OUTPATIENT_CLINIC_OR_DEPARTMENT_OTHER): Payer: Medicare Other

## 2023-04-21 ENCOUNTER — Emergency Department (HOSPITAL_BASED_OUTPATIENT_CLINIC_OR_DEPARTMENT_OTHER)
Admission: EM | Admit: 2023-04-21 | Discharge: 2023-04-21 | Disposition: A | Discharge status: Home / Self Care | Payer: Medicare Other | Attending: Emergency Medicine | Admitting: Emergency Medicine

## 2023-04-21 ENCOUNTER — Encounter (HOSPITAL_BASED_OUTPATIENT_CLINIC_OR_DEPARTMENT_OTHER): Payer: Self-pay

## 2023-04-21 DIAGNOSIS — R1032 Left lower quadrant pain: Secondary | ICD-10-CM | POA: Diagnosis not present

## 2023-04-21 DIAGNOSIS — S29002A Unspecified injury of muscle and tendon of back wall of thorax, initial encounter: Secondary | ICD-10-CM | POA: Diagnosis present

## 2023-04-21 DIAGNOSIS — S0990XA Unspecified injury of head, initial encounter: Secondary | ICD-10-CM | POA: Diagnosis present

## 2023-04-21 DIAGNOSIS — W1839XA Other fall on same level, initial encounter: Secondary | ICD-10-CM | POA: Diagnosis not present

## 2023-04-21 DIAGNOSIS — S22000A Wedge compression fracture of unspecified thoracic vertebra, initial encounter for closed fracture: Secondary | ICD-10-CM | POA: Insufficient documentation

## 2023-04-21 DIAGNOSIS — Z7982 Long term (current) use of aspirin: Secondary | ICD-10-CM | POA: Insufficient documentation

## 2023-04-21 DIAGNOSIS — M542 Cervicalgia: Secondary | ICD-10-CM | POA: Diagnosis not present

## 2023-04-21 DIAGNOSIS — W19XXXA Unspecified fall, initial encounter: Secondary | ICD-10-CM

## 2023-04-21 MED ORDER — ACETAMINOPHEN 325 MG PO TABS
650.0000 mg | ORAL_TABLET | Freq: Once | ORAL | Status: AC
  Administered 2023-04-21: 650 mg via ORAL
  Filled 2023-04-21: qty 2

## 2023-04-21 NOTE — ED Notes (Signed)
D/c paperwork reviewed with pt and pts family at bedside, including follow up care.  All questions and/or concerns addressed at time of d/c.  No further needs expressed. . Pt verbalized understanding, Wheeled by ED staff to ED exit, NAD.

## 2023-04-21 NOTE — ED Notes (Signed)
Pt ambulatory with rolling walker appx 50 feet, standby assistance. Pt denies dizziness, denies pain.  Steady gait.

## 2023-04-21 NOTE — ED Provider Notes (Signed)
[place] [facility] AT [facility] Provider Note   CSN: [id number] Arrival date & time: 04/21/23  4098     History  Chief Complaint  Patient presents with   Marletta Lor    [name] is a 89 y.o. female.   Fall     89 year old female presenting to the emergency department as a nonlevel trauma after ground-level fall.  The patient states that she woke up this morning and was ambulatory to turn off a lamp when she stumbled and fell backwards after bumping into her walker.  She thinks she may have struck the back of her head upper neck on the bed on the way down.  She denies full loss of consciousness, was found 45 minutes later on the ground by a home health nurse who came in at 7.  She endorses mid thoracic/upper thoracic back pain, lower cervical neck pain.  She denies any other injuries or complaints.  She is not on anticoagulation.  She arrives GCS 15, ABC intact.  Home Medications Prior to Admission medications   Medication Sig Start Date End Date Taking? Authorizing Provider  acetaminophen (TYLENOL) 500 MG tablet Take 1,000 mg by mouth 2 (two) times daily.     [provider]  amLODipine (NORVASC) 2.5 MG tablet Take 2.5 mg by mouth at bedtime. 12/23/18   [provider]  amLODipine (NORVASC) 2.5 MG tablet Take 1 tablet by mouth daily. 12/16/21   [provider]  Ascorbic Acid (VITAMIN C PO) Take 1 tablet by mouth every morning.    [provider]  aspirin EC 81 MG tablet Take 81 mg by mouth every morning.     [provider]  aspirin EC 81 MG tablet Take 1 tablet by mouth daily.    [provider]  Calcium Carbonate-Vitamin D (CALCIUM 600+D PO) Take 1 tablet by mouth 2 (two) times daily.    [provider]  Cholecalciferol (VITAMIN D3 PO) Take 1 tablet by mouth every morning.    [provider]  gabapentin (NEURONTIN) 100 MG capsule Take 1 capsule by mouth daily. 06/10/16   [provider]  hydrochlorothiazide (MICROZIDE) 12.5 MG capsule Take 12.5 mg by mouth every Monday, Wednesday, and Friday.     [provider]  lisinopril (PRINIVIL,ZESTRIL) 10 MG tablet Take 10 mg by mouth daily.    [provider]  MAGNESIUM PO Take 1 tablet by mouth every morning.    [provider]  mirabegron ER (MYRBETRIQ) 25 MG TB24 tablet     [provider]  mirtazapine (REMERON) 7.5 MG tablet Take 7.5 mg by mouth at bedtime. 08/19/21   [provider]  Multiple Vitamin (MULTIVITAMIN WITH MINERALS) TABS tablet Take 1 tablet by mouth every morning.    [provider]  oxybutynin (DITROPAN-XL) 5 MG 24 hr tablet Take 5 mg by mouth at bedtime. 11/29/18   [provider]  pantoprazole (PROTONIX) 40 MG tablet Take 1 tablet (40 mg total) by mouth daily. Take 30-60 minutes before meals Patient taking differently: Take 40 mg by mouth daily before supper.  12/02/18   Unk Lightning, PA  Polyethyl Glycol-Propyl Glycol (SYSTANE OP) Place 1 drop into both eyes 2 (two) times daily.    [provider]  potassium chloride (MICRO-K) 10 MEQ CR capsule Take 10 mEq by mouth daily. 12/30/18   [provider]  rosuvastatin (CRESTOR) 10 MG tablet Take 10 mg by mouth daily.    [provider]  traMADol (ULTRAM) 50 MG tablet     [provider]  traZODone (DESYREL) 50 MG tablet Take 50 mg by mouth at bedtime. 12/11/21   [provider]      Allergies    Patient has no known allergies.    Review of Systems   Review of Systems  All other systems reviewed and are negative.   Physical Exam Updated Vital Signs BP (!) 165/74   Pulse 82   Temp (!) 97.5 F (36.4 C) (Oral)   Resp 15   Ht 5\' 3"  (1.6 m)   Wt 56.7 kg   SpO2 98%   BMI 22.14 kg/m  Physical Exam Vitals and nursing note reviewed.  Constitutional:      General: She is not in acute distress.    Appearance: She is well-developed.      Comments: GCS 15, ABC intact  HENT:     Head: Normocephalic and atraumatic.  Eyes:     Extraocular Movements: Extraocular movements intact.     Conjunctiva/sclera: Conjunctivae normal.     Pupils: Pupils are equal, round, and reactive to light.  Neck:     Comments: Midline lower cervical spine TTP Cardiovascular:     Rate and Rhythm: Normal rate and regular rhythm.     Heart sounds: No murmur heard. Pulmonary:     Effort: Pulmonary effort is normal. No respiratory distress.     Breath sounds: Normal breath sounds.  Chest:     Comments: Clavicles stable nontender to AP compression.  Chest wall stable and nontender to AP and lateral compression. Abdominal:     Palpations: Abdomen is soft.     Tenderness: There is abdominal tenderness in the left lower quadrant.     Comments: Pelvis stable to lateral compression. Very mild LLQ TTP.  Musculoskeletal:     Cervical back: Neck supple.     Comments: No midline tenderness to palpation of the lumbar spine. Midline upper T spine TTP.  Extremities atraumatic with intact range of motion  Skin:    General: Skin is warm and dry.  Neurological:     Mental Status: She is alert.     Comments: Cranial nerves II through XII grossly intact.  Moving all 4 extremities spontaneously.  Sensation grossly intact all 4 extremities     ED Results / Procedures / Treatments   Labs (all labs ordered are listed, but only abnormal results are displayed) Labs Reviewed - No data to display  EKG None  Radiology CT Thoracic Spine Wo Contrast Result Date: 04/21/2023 CLINICAL DATA:  Larey Seat backwards this morning, neck pain. EXAM: CT THORACIC SPINE WITHOUT CONTRAST TECHNIQUE: Multidetector CT images of the thoracic were obtained using the standard protocol without intravenous contrast. RADIATION DOSE REDUCTION: This exam was performed according to the departmental dose-optimization program which includes automated exposure control, adjustment of the mA and/or kV  according to patient size and/or use of iterative reconstruction technique. COMPARISON:  Spinal radiograph 10/18/2015 FINDINGS: Alignment: Thoracic kyphosis is maintained. No significant listhesis. Vertebrae: Age-indeterminate mild compression deformities of the T2 and T3 vertebral bodies. There is retropulsion of fracture fragments along the inferior margin of T3 by approximately 4 mm. Additional compression deformity of T4 with up to 60% height loss centrally. Additional mild compression deformity of T6. Remote compression fracture of T9 status post vertebroplasty. Additional compression deformity of T11 with up to 40% height loss anteriorly. Gas within the adjacent disc spaces suggests chronic etiology. Additional compression fracture of T12 status  post vertebroplasty. Proximally 3 mm retropulsion at the superior aspect of T11 and inferior aspect of T12. No suspicious lytic or blastic osseous lesions. Paraspinal and other soft tissues: The paraspinal musculature is unremarkable. Ectatic aortic arch measuring up to 3.5 cm in diameter. Coronary artery atherosclerotic calcifications. Scarring versus atelectasis in the dependent lung bases. Patulous appearance of the midthoracic esophagus. Small hiatal hernia. Mild atherosclerosis of the aortic arch and descending thoracic aorta additional atherosclerosis involving the visualized abdominal aorta and SMA origin. Punctate nonobstructing calculus in the upper pole of the left kidney. Disc levels: Intervertebral disc space narrowing at multiple levels. Disc bulge and posterior osteophytes at T1-2 without high-grade osseous spinal canal stenosis. Retropulsed fracture fragments at T3 resulting in mild spinal canal stenosis. Additional disc bulge and osteophytes at T4-5 without significant spinal canal stenosis. Left paracentral osteophytes at C6-7 without significant spinal canal stenosis. Additional disc bulges at multiple levels. There is no evidence of high-grade  osseous spinal canal stenosis in the thoracic spine. Facet arthrosis at multiple levels. Moderate foraminal narrowing bilaterally at T3-4 secondary to facet arthrosis as well as retropulsed fracture fragments. Additional moderate foraminal narrowing bilaterally at T4-5. Moderate foraminal narrowing on the right at C6-7. Additional mild foraminal narrowing at T7-8 and T8-9. Moderate foraminal narrowing on the left at T9-10. IMPRESSION: 1. Age-indeterminate compression fractures of the T2 through T4, and T6 vertebral bodies. Retropulsion of fracture fragments at T3 contributing to mild spinal canal stenosis and moderate bilateral foraminal stenosis. Consider MRI for further evaluation. 2. Remote compression fractures of T9 and T12 status post vertebroplasty. Additional likely chronic compression fracture of T11. 3. Degenerative changes as above. No high-grade stenosis of the osseous thoracic spinal canal. Moderate foraminal narrowing at multiple levels. 4. Ectatic aortic arch measuring up to 3.5 cm in diameter. 5. Small hiatal hernia. Patulous appearance of the midthoracic esophagus. 6. Punctate non-obstructing calculus in the upper pole left kidney. 7.  Aortic Atherosclerosis (ICD10-I70.0). Electronically Signed   By: Emily Filbert M.D.   On: 04/21/2023 11:19   CT Cervical Spine Wo Contrast Result Date: 04/21/2023 CLINICAL DATA:  Larey Seat backwards this morning, left-sided neck pain. EXAM: CT CERVICAL SPINE WITHOUT CONTRAST TECHNIQUE: Multidetector CT imaging of the cervical spine was performed without intravenous contrast. Multiplanar CT image reconstructions were also generated. RADIATION DOSE REDUCTION: This exam was performed according to the departmental dose-optimization program which includes automated exposure control, adjustment of the mA and/or kV according to patient size and/or use of iterative reconstruction technique. COMPARISON:  CT cervical spine 07/07/2019 FINDINGS: Alignment: Straightening of the  normal cervical lordosis. Similar trace retrolisthesis of C3 on C4 and C5 on C6 which is degenerative appearing. Additional trace anterolisthesis of C4 on C5 and C7 on T1 which is degenerative appearing. No facet subluxation or dislocation. Skull base and vertebrae: No acute fracture. No primary bone lesion or focal pathologic process. Soft tissues and spinal canal: The paraspinal musculature is unremarkable. No prevertebral soft tissue swelling. Prominent calcified atherosclerosis involving the proximal aspect of the common carotid arteries and bilateral subclavian arteries. Disc levels: Intervertebral disc space narrowing at multiple levels most pronounced at C3-4. Degenerative endplate osteophytes throughout the cervical spine. Disc osteophyte complex at C3-4 contributes to moderate osseous spinal canal narrowing. Additional disc osteophyte complexes at C5-6 and C6-7 contributes to moderate spinal canal narrowing. Facet arthrosis throughout the cervical spine. There is significant foraminal narrowing at multiple levels similar to prior. Upper chest: Negative. Other: None. IMPRESSION: No acute fracture or traumatic malalignment of  the cervical spine. Degenerative changes of the cervical spine as above. Disc space narrowing at C3-4 is increased from the prior study. Moderate spinal canal narrowing at C3-4, C5-6, and C6-7. Significant foraminal narrowing at multiple levels, similar to prior. Electronically Signed   By: Emily Filbert M.D.   On: 04/21/2023 10:59   CT Head Wo Contrast Result Date: 04/21/2023 CLINICAL DATA:  Larey Seat backwards this morning now with left-sided neck pain. EXAM: CT HEAD WITHOUT CONTRAST TECHNIQUE: Contiguous axial images were obtained from the base of the skull through the vertex without intravenous contrast. RADIATION DOSE REDUCTION: This exam was performed according to the departmental dose-optimization program which includes automated exposure control, adjustment of the mA and/or kV  according to patient size and/or use of iterative reconstruction technique. COMPARISON:  MRI head 09/13/2019, CT head 07/07/2019. FINDINGS: Brain: No acute intracranial hemorrhage. No CT evidence of acute infarct. Nonspecific hypoattenuation in the periventricular and subcortical white matter favored to reflect chronic microvascular ischemic changes. No edema, mass effect, or midline shift. The basilar cisterns are patent. Ventricles: Prominence of the ventricles suggesting underlying parenchymal volume loss, similar to prior studies. Vascular: No hyperdense vessel. Atherosclerotic calcifications of the intracranial ICAs. Skull: No acute or aggressive finding. Sinuses/orbits: Similar postsurgical changes of the orbits. Paranasal sinuses are clear. Other: Mastoid air cells are clear. IMPRESSION: No CT evidence of acute intracranial abnormality. Moderate chronic microvascular ischemic changes and parenchymal volume loss, similar to prior. Electronically Signed   By: Emily Filbert M.D.   On: 04/21/2023 10:46    Procedures Procedures    Medications Ordered in ED Medications  acetaminophen (TYLENOL) tablet 650 mg (650 mg Oral Given 04/21/23 0944)    ED Course/ Medical Decision Making/ A&P                                 Medical Decision Making Amount and/or Complexity of Data Reviewed Radiology: ordered.  Risk OTC drugs.    89 year old female presenting to the emergency department as a nonlevel trauma after ground-level fall.  The patient states that she woke up this morning and was ambulatory to turn off a lamp when she stumbled and fell backwards after bumping into her walker.  She thinks she may have struck the back of her head upper neck on the bed on the way down.  She denies full loss of consciousness, was found 45 minutes later on the ground by a home health nurse who came in at 7.  She endorses mid thoracic/upper thoracic back pain, lower cervical neck pain.  She denies any other injuries  or complaints.  She is not on anticoagulation.  She arrives GCS 15, ABC intact.  On arrival, the patient was vitally stable, mildly hypertensive BP 165/80.  Physical exam as per above with mid thoracic tenderness to palpation, lower cervical tenderness to palpation.  Very mild abdominal tenderness, patient without trauma to the abdomen, had not been complaining of abdominal pain prior to my exam.  Low concern for acute intra-abdominal abnormality at this time.  CT Head: IMPRESSION:  No CT evidence of acute intracranial abnormality.    Moderate chronic microvascular ischemic changes and parenchymal  volume loss, similar to prior.    CT Cervical Spine: IMPRESSION:  No acute fracture or traumatic malalignment of the cervical spine.    Degenerative changes of the cervical spine as above. Disc space  narrowing at C3-4 is increased from the prior study. Moderate  spinal  canal narrowing at C3-4, C5-6, and C6-7. Significant foraminal  narrowing at multiple levels, similar to prior.   CT Thoracic Spine: IMPRESSION:  1. Age-indeterminate compression fractures of the T2 through T4, and  T6 vertebral bodies. Retropulsion of fracture fragments at T3  contributing to mild spinal canal stenosis and moderate bilateral  foraminal stenosis. Consider MRI for further evaluation.  2. Remote compression fractures of T9 and T12 status post  vertebroplasty. Additional likely chronic compression fracture of  T11.  3. Degenerative changes as above. No high-grade stenosis of the  osseous thoracic spinal canal. Moderate foraminal narrowing at  multiple levels.  4. Ectatic aortic arch measuring up to 3.5 cm in diameter.  5. Small hiatal hernia. Patulous appearance of the midthoracic  esophagus.  6. Punctate non-obstructing calculus in the upper pole left kidney.  7.  Aortic Atherosclerosis (ICD10-I70.0).    Pt neuro intact. No saddle anesthesia. Has had recent multiple falls with associated gait  instability.  Patient has 5 out of 5 strength in the bilateral lower extremities with intact sensation to light touch with no saddle anesthesia.  She states that she has had chronic urinary incontinence for the last 2 years.  No fecal incontinence that is new.    MRI had been recommended for further evaluation however given the patient is neurologically intact, do not think emergent MRI is indicated at this time.  Discussed with on-call Neurosurgery, Donna Stack, PA who agreed with the plan of care.   Patient was ambulatory in the emergency department without significant pain with a rolling walker at her baseline approximately 50 feet.  Denied pain on ambulation.  Overall stable and comfortable to plan for discharge with continued home health, outpatient follow-up with her PCP and neurosurgery as needed if symptoms worsen.  Recommended Tylenol and Motrin for pain control.   Final Clinical Impression(s) / ED Diagnoses Final diagnoses:  Fall, initial encounter  Thoracic compression fracture, closed, initial encounter [facility])    Rx / DC Orders ED Discharge Orders     None         Ernie Avena, MD 04/21/23 1223

## 2023-04-21 NOTE — Discharge Instructions (Addendum)
Please follow-up with your primary care physician regarding your compression fractures.  Return for any severe worsening back pain, worsening weakness in your lower extremities, numbness in your groin area where you would sit on a saddle, new fecal incontinence as this would warrant emergent MRI imaging.

## 2023-04-21 NOTE — ED Triage Notes (Signed)
BIB EMS from home, states "stumbled" this morning when she went to turn off lamp, bumped into walker and fell backwards onto bottom. C/o left sided neck pain, no spinal tenderness. Denies hitting head or LOC. Not on thinners. Has scabbing wound with swelling on right forearm from previous fall a few weeks ago where she states she fractured her elbow.

## 2023-07-20 ENCOUNTER — Encounter (HOSPITAL_BASED_OUTPATIENT_CLINIC_OR_DEPARTMENT_OTHER): Payer: Self-pay | Admitting: Emergency Medicine

## 2023-07-20 ENCOUNTER — Emergency Department (HOSPITAL_BASED_OUTPATIENT_CLINIC_OR_DEPARTMENT_OTHER)

## 2023-07-20 ENCOUNTER — Emergency Department (HOSPITAL_BASED_OUTPATIENT_CLINIC_OR_DEPARTMENT_OTHER)
Admission: EM | Admit: 2023-07-20 | Discharge: 2023-07-20 | Disposition: A | Discharge status: Home / Self Care | Attending: Emergency Medicine | Admitting: Emergency Medicine

## 2023-07-20 ENCOUNTER — Other Ambulatory Visit: Payer: Self-pay

## 2023-07-20 DIAGNOSIS — Z23 Encounter for immunization: Secondary | ICD-10-CM | POA: Diagnosis not present

## 2023-07-20 DIAGNOSIS — F0390 Unspecified dementia without behavioral disturbance: Secondary | ICD-10-CM | POA: Diagnosis not present

## 2023-07-20 DIAGNOSIS — S60812A Abrasion of left wrist, initial encounter: Secondary | ICD-10-CM | POA: Insufficient documentation

## 2023-07-20 DIAGNOSIS — S0181XA Laceration without foreign body of other part of head, initial encounter: Secondary | ICD-10-CM | POA: Diagnosis not present

## 2023-07-20 DIAGNOSIS — S60212A Contusion of left wrist, initial encounter: Secondary | ICD-10-CM

## 2023-07-20 DIAGNOSIS — S0990XA Unspecified injury of head, initial encounter: Secondary | ICD-10-CM

## 2023-07-20 DIAGNOSIS — Z7982 Long term (current) use of aspirin: Secondary | ICD-10-CM | POA: Diagnosis not present

## 2023-07-20 DIAGNOSIS — W19XXXA Unspecified fall, initial encounter: Secondary | ICD-10-CM | POA: Insufficient documentation

## 2023-07-20 MED ORDER — TETANUS-DIPHTH-ACELL PERTUSSIS 5-2.5-18.5 LF-MCG/0.5 IM SUSY
0.5000 mL | PREFILLED_SYRINGE | Freq: Once | INTRAMUSCULAR | Status: AC
Start: 2023-07-20 — End: 2023-07-20
  Administered 2023-07-20: 0.5 mL via INTRAMUSCULAR
  Filled 2023-07-20: qty 0.5

## 2023-07-20 MED ORDER — LIDOCAINE-EPINEPHRINE (PF) 2 %-1:200000 IJ SOLN
10.0000 mL | Freq: Once | INTRAMUSCULAR | Status: AC
  Administered 2023-07-20: 10 mL
  Filled 2023-07-20: qty 20

## 2023-07-20 NOTE — ED Triage Notes (Addendum)
 Pt from [employer] , accompanied by daughter . Pt had af all today at the facility . Unable to recall the fall . Presents with obvious left forehead laceration , pressure dressing to lac , bleeding controled. Alert and oriented x 4. No blood thinners .

## 2023-07-20 NOTE — ED Provider Notes (Signed)
 Physical Exam  BP (!) 155/88   Pulse 91   Temp 99 F (37.2 C) (Oral)   Resp 19   Wt 47.6 kg   SpO2 98%   BMI 18.60 kg/m   Physical Exam Cardiovascular:     Rate and Rhythm: Normal rate and regular rhythm.  Pulmonary:     Effort: Pulmonary effort is normal.     Breath sounds: Normal breath sounds.  Abdominal:     General: Abdomen is flat. Bowel sounds are normal.     Palpations: Abdomen is soft.  Skin:    Comments: Approx. 4cm lac to left forehead. Skin abrasion to left wrist, bleeding well controled. Strong radial pulse, soft comparments, normal ROM      Procedures  .Laceration Repair  Date/Time: 07/20/2023 4:32 PM  Performed by: Eudora Heron, PA-C Authorized by: Eudora Heron, PA-C   Consent:    Consent obtained:  Verbal   Consent given by:  Patient and guardian   Risks, benefits, and alternatives were discussed: yes     Risks discussed:  Infection, need for additional repair, nerve damage, poor wound healing, poor cosmetic result, pain, tendon damage, vascular damage and retained foreign body   Alternatives discussed:  No treatment, delayed treatment, observation and referral Universal protocol:    Procedure explained and questions answered to patient or proxy's satisfaction: yes     Relevant documents present and verified: yes     Test results available: yes     Imaging studies available: yes     Required blood products, implants, devices, and special equipment available: yes     Site/side marked: yes     Immediately prior to procedure, a time out was called: yes     Patient identity confirmed:  Verbally with patient Anesthesia:    Anesthesia method:  Local infiltration   Local anesthetic:  Lidocaine  2% WITH epi Laceration details:    Location:  Scalp   Scalp location:  Frontal   Length (cm):  4 Pre-procedure details:    Preparation:  Patient was prepped and draped in usual sterile fashion and imaging obtained to evaluate for foreign  bodies Treatment:    Area cleansed with:  Povidone-iodine   Amount of cleaning:  Standard   Irrigation solution:  Sterile saline   Irrigation method:  Pressure wash   Visualized foreign bodies/material removed: yes     Debridement:  None   Undermining:  None Skin repair:    Repair method:  Sutures   Suture size:  4-0   Suture material:  Nylon   Suture technique:  Simple interrupted   Number of sutures:  6 Approximation:    Approximation:  Close Repair type:    Repair type:  Simple Post-procedure details:    Dressing:  Antibiotic ointment   Procedure completion:  Tolerated   ED Course / MDM   Clinical Course as of 07/20/23 1631  Mon Jul 20, 2023  1534 Patient had a fall, she is not on blood thinners but she did hit her head.  She has lac to upper left side of forehead.  Laceration needs to be repaired.  She needs updated tetanus shot.  Dispo pending CT head and neck.  [JB]    Clinical Course User Index [JB] Suhaan Perleberg, Kandace Organ, PA-C   Medical Decision Making Amount and/or Complexity of Data Reviewed Radiology: ordered.  Risk Prescription drug management.   Patient reporting to emergency room with fall.  In short patient has laceration to left forehead  that needs repaired.  Patient also has small abrasion to left wrist which needs cleaned and covered.  Dispo was ultimately pending imaging.  She is stable and well-appearing she is not a blood thinner but she did hit her head when she fell. No neck pain, otherwise moving extremities without difficulty.   Laceration was repaired with standard suture technique and no complications.  She is neurovascularly intact.  She was given updated tetanus shot while in emergency room today.  CT head and neck: no acute findings.   Patient stable for discharge.  Given return precautions.  Given follow-up instructions.       Adina Ahle 07/20/23 Coit Dasen    Sueellen Emery, MD 07/21/23 [phone]

## 2023-07-20 NOTE — ED Notes (Signed)
 Bacitracin applied to pts forehead wound, non adherent dressing applied and secured with medipore tape. Pt with left forearm skin tear, mepitel dressing applied and secured with medipore tape.  Pt educated on appropriate wound care and signs of infection.

## 2023-07-20 NOTE — Discharge Instructions (Addendum)
 6 sutures were placed today.  Please have them removed in 10-14 days.  Keep the area clean dry and covered you can wash with gentle soap and water.  Apply bacitracin or Neosporin twice daily to the wound.  You can take Tylenol  for pain control.  For left wrist abrasion -- Keep the area clean dry and covered you can wash with gentle soap and water.  Apply bacitracin or Neosporin twice daily to the wound. You can apply ice to wound.   Return for signs of infection like fever, surrounding redness or draining from the wound.

## 2023-07-20 NOTE — ED Provider Notes (Signed)
 [name] Provider Note   CSN: [id number] Arrival date & time: 07/20/23  1357     History  Chief Complaint  Patient presents with   [name]    [name] is a 89 y.o. female.  Patient presents to the emergency department for evaluation of injury sustained during a fall occurring about an hour prior to arrival.  Patient lives in skilled nursing at [facility].  She had an unwitnessed fall onto a tile floor.  Patient was able to call for help.  No evidence suggesting loss of consciousness.  Patient has history of dementia and cannot remember the fall.  She falls frequently.  She sustained a laceration to her left supraorbital area.  Vaseline gauze applied prior to arrival.  Patient otherwise is at her baseline.  No confusion or vomiting.  No recent infectious symptoms.  She is moving her arms and legs at baseline.  No treatments prior to arrival otherwise.       Home Medications Prior to Admission medications   Medication Sig Start Date End Date Taking? Authorizing Provider  acetaminophen  (TYLENOL ) 500 MG tablet Take 1,000 mg by mouth 2 (two) times daily.     [provider]  amLODipine (NORVASC) 2.5 MG tablet Take 2.5 mg by mouth at bedtime. 12/23/18   [provider]  amLODipine (NORVASC) 2.5 MG tablet Take 1 tablet by mouth daily. 12/16/21   [provider]  Ascorbic Acid (VITAMIN C PO) Take 1 tablet by mouth every morning.    [provider]  aspirin EC 81 MG tablet Take 81 mg by mouth every morning.     [provider]  aspirin EC 81 MG tablet Take 1 tablet by mouth daily.    [provider]  Calcium Carbonate-Vitamin D (CALCIUM 600+D PO) Take 1 tablet by mouth 2 (two) times daily.    [provider]  Cholecalciferol (VITAMIN D3 PO) Take 1 tablet by mouth every morning.    [provider]  gabapentin  (NEURONTIN ) 100 MG capsule Take 1 capsule by mouth daily.  06/10/16   [provider]  hydrochlorothiazide (MICROZIDE) 12.5 MG capsule Take 12.5 mg by mouth every Monday, Wednesday, and Friday.     [provider]  lisinopril (PRINIVIL,ZESTRIL) 10 MG tablet Take 10 mg by mouth daily.    [provider]  MAGNESIUM PO Take 1 tablet by mouth every morning.    [provider]  mirabegron ER (MYRBETRIQ) 25 MG TB24 tablet     [provider]  mirtazapine (REMERON) 7.5 MG tablet Take 7.5 mg by mouth at bedtime. 08/19/21   [provider]  Multiple Vitamin (MULTIVITAMIN WITH MINERALS) TABS tablet Take 1 tablet by mouth every morning.    [provider]  oxybutynin (DITROPAN-XL) 5 MG 24 hr tablet Take 5 mg by mouth at bedtime. 11/29/18   [provider]  pantoprazole  (PROTONIX ) 40 MG tablet Take 1 tablet (40 mg total) by mouth daily. Take 30-60 minutes before meals Patient taking differently: Take 40 mg by mouth daily before supper.  12/02/18   Graciella Lavender, PA  Polyethyl Glycol-Propyl Glycol (SYSTANE OP) Place 1 drop into both eyes 2 (two) times daily.    [provider]  potassium chloride (MICRO-K) 10 MEQ CR capsule Take 10 mEq by mouth daily. 12/30/18   [provider]  rosuvastatin (CRESTOR) 10 MG tablet Take 10 mg by mouth daily.    [provider]  traMADol (ULTRAM) 50 MG tablet     [provider]  traZODone (DESYREL) 50 MG tablet Take 50 mg by mouth at bedtime. 12/11/21   [provider]      Allergies    Patient has no known allergies.    Review of Systems   Review of Systems  Physical Exam Updated Vital Signs BP 126/61 (BP Location: Left Arm)   Pulse 97   Temp 99 F (37.2 C) (Oral)   Resp 18   Wt 47.6 kg   SpO2 94%   BMI 18.60 kg/m  Physical Exam Vitals and nursing note reviewed.  Constitutional:      Appearance: She is well-developed.  HENT:     Head: Normocephalic. No raccoon eyes or Battle's sign.      Comments: 3 cm, linear, hemostatic laceration to the left superior orbital area.  Wound base is clean.  Wound is moderately gaping.    Right Ear: Tympanic membrane, ear canal and external ear normal. No hemotympanum.     Left Ear: Tympanic membrane, ear canal and external ear normal. No hemotympanum.     Nose: Nose normal.     Mouth/Throat:     Pharynx: Uvula midline.  Eyes:     General: Lids are normal.     Extraocular Movements:     Right eye: No nystagmus.     Left eye: No nystagmus.     Conjunctiva/sclera: Conjunctivae normal.     Pupils: Pupils are equal, round, and reactive to light.     Comments: No visible hyphema noted  Cardiovascular:     Rate and Rhythm: Normal rate and regular rhythm.  Pulmonary:     Effort: Pulmonary effort is normal.     Breath sounds: Normal breath sounds.  Abdominal:     Palpations: Abdomen is soft.     Tenderness: There is no abdominal tenderness.  Musculoskeletal:     Cervical back: Normal range of motion and neck supple. No tenderness or bony tenderness.     Thoracic back: No tenderness or bony tenderness.     Lumbar back: No tenderness or bony tenderness.     Comments: Patient moves all joints of the upper and lower extremities without difficulty.  She is able to lift her legs up off the bed independently.  Skin:    General: Skin is warm and dry.  Neurological:     Mental Status: She is alert and oriented to person, place, and time.     GCS: GCS eye subscore is 4. GCS verbal subscore is 5. GCS motor subscore is 6.     Cranial Nerves: No cranial nerve deficit.     Sensory: No sensory deficit.     Coordination: Coordination normal.     ED Results / Procedures / Treatments   Labs (all labs ordered are listed, but only abnormal results are displayed) Labs Reviewed - No data to display  EKG None  Radiology No results found.  Procedures Procedures    Medications Ordered in ED Medications - No data to display  ED Course/ Medical  Decision Making/ A&P    Patient seen and examined. History obtained directly from patient.   Labs/EKG: None ordered  Imaging: Ordered CT head, CT cervical spine  Medications/Fluids: Ordered: Lidocaine  2% with epinephrine   Most recent vital signs reviewed and are as follows: BP 126/61 (BP Location: Left Arm)   Pulse 97   Temp 99 F (37.2 C) (Oral)   Resp 18   Wt 47.6  kg   SpO2 94%   BMI 18.60 kg/m   Initial impression: Patient with fall, history of frequent falls, minor head injury, no anticoagulation.  She does have a facial laceration.  This will require repair with sutures.  CT head and neck ordered.  3:11 PM Signout to Barrett PA-C at shift change.  Pending CT imaging.  Will need wound repair.                                 Medical Decision Making Amount and/or Complexity of Data Reviewed Radiology: ordered.  Risk Prescription drug management.   Patient presents after unwitnessed fall.  She is at her neurologic baseline.  Awaiting imaging results.  Will need wound repair.        Final Clinical Impression(s) / ED Diagnoses Final diagnoses:  Minor head injury, initial encounter  Facial laceration, initial encounter    Rx / DC Orders ED Discharge Orders     None         Lyna Sandhoff, PA-C 07/20/23 1512    Nicklas Barns, MD 07/20/23 1715

## 2023-07-20 NOTE — ED Notes (Signed)
 Pt transported to imaging.
